# Patient Record
Sex: Female | Born: 1940 | Race: White | Hispanic: No | Smoking: Never smoker
Health system: Southern US, Community
[De-identification: ages and names within clinical notes are randomized; demographics above are authoritative.]

## PROBLEM LIST (undated history)

## (undated) DIAGNOSIS — H9193 Unspecified hearing loss, bilateral: Secondary | ICD-10-CM

## (undated) DIAGNOSIS — E782 Mixed hyperlipidemia: Secondary | ICD-10-CM

## (undated) DIAGNOSIS — K635 Polyp of colon: Secondary | ICD-10-CM

## (undated) DIAGNOSIS — E559 Vitamin D deficiency, unspecified: Secondary | ICD-10-CM

## (undated) DIAGNOSIS — M8580 Other specified disorders of bone density and structure, unspecified site: Secondary | ICD-10-CM

## (undated) DIAGNOSIS — E538 Deficiency of other specified B group vitamins: Secondary | ICD-10-CM

## (undated) HISTORY — PX: COLONOSCOPY: SHX174

## (undated) HISTORY — PX: BREAST EXCISIONAL BIOPSY: SUR124

---
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
## 1978-08-17 HISTORY — PX: APPENDECTOMY: SHX54

## 2011-04-03 ENCOUNTER — Ambulatory Visit: Payer: Self-pay | Admitting: Otolaryngology

## 2011-08-18 HISTORY — PX: SHOULDER ARTHROSCOPY W/ ROTATOR CUFF REPAIR: SHX2400

## 2012-07-08 ENCOUNTER — Ambulatory Visit: Payer: Self-pay | Admitting: Otolaryngology

## 2012-07-08 LAB — CREATININE, SERUM
EGFR (African American): >60
EGFR (Non-African Amer.): >60

## 2013-08-17 HISTORY — PX: CATARACT EXTRACTION W/ INTRAOCULAR LENS IMPLANT: SHX1309

## 2013-10-15 HISTORY — PX: CATARACT EXTRACTION W/ INTRAOCULAR LENS IMPLANT: SHX1309

## 2014-08-17 DIAGNOSIS — K811 Chronic cholecystitis: Secondary | ICD-10-CM

## 2014-08-17 HISTORY — DX: Chronic cholecystitis: K81.1

## 2015-02-15 HISTORY — PX: CHOLECYSTECTOMY: SHX55

## 2018-03-19 ENCOUNTER — Emergency Department
Admission: EM | Admit: 2018-03-19 | Discharge: 2018-03-19 | Disposition: A | Discharge status: Home / Self Care | Payer: Medicare Other | Attending: Emergency Medicine | Admitting: Emergency Medicine

## 2018-03-19 ENCOUNTER — Other Ambulatory Visit: Payer: Self-pay

## 2018-03-19 ENCOUNTER — Emergency Department: Payer: Medicare Other

## 2018-03-19 ENCOUNTER — Encounter: Payer: Self-pay | Admitting: Emergency Medicine

## 2018-03-19 DIAGNOSIS — Y9301 Activity, walking, marching and hiking: Secondary | ICD-10-CM | POA: Insufficient documentation

## 2018-03-19 DIAGNOSIS — S0990XA Unspecified injury of head, initial encounter: Secondary | ICD-10-CM | POA: Diagnosis present

## 2018-03-19 DIAGNOSIS — S01111A Laceration without foreign body of right eyelid and periocular area, initial encounter: Secondary | ICD-10-CM | POA: Insufficient documentation

## 2018-03-19 DIAGNOSIS — Y929 Unspecified place or not applicable: Secondary | ICD-10-CM | POA: Diagnosis not present

## 2018-03-19 DIAGNOSIS — Y999 Unspecified external cause status: Secondary | ICD-10-CM | POA: Diagnosis not present

## 2018-03-19 DIAGNOSIS — S0181XA Laceration without foreign body of other part of head, initial encounter: Secondary | ICD-10-CM

## 2018-03-19 DIAGNOSIS — W19XXXA Unspecified fall, initial encounter: Secondary | ICD-10-CM

## 2018-03-19 DIAGNOSIS — W108XXA Fall (on) (from) other stairs and steps, initial encounter: Secondary | ICD-10-CM | POA: Insufficient documentation

## 2018-03-19 DIAGNOSIS — S0083XA Contusion of other part of head, initial encounter: Secondary | ICD-10-CM

## 2018-03-19 MED ORDER — BACITRACIN ZINC 500 UNIT/GM EX OINT
1.0000 "application " | TOPICAL_OINTMENT | Freq: Once | CUTANEOUS | Status: AC
  Administered 2018-03-19: 1 via TOPICAL
  Filled 2018-03-19: qty 0.9

## 2018-03-19 MED ORDER — LIDOCAINE-EPINEPHRINE (PF) 2 %-1:200000 IJ SOLN
10.0000 mL | Freq: Once | INTRAMUSCULAR | Status: DC
  Filled 2018-03-19 (×2): qty 10

## 2018-03-19 MED ORDER — LIDOCAINE-EPINEPHRINE-TETRACAINE (LET) SOLUTION
3.0000 mL | Freq: Once | NASAL | Status: AC
  Administered 2018-03-19: 3 mL via TOPICAL
  Filled 2018-03-19: qty 3

## 2018-03-19 MED ORDER — LIDOCAINE HCL (PF) 1 % IJ SOLN
5.0000 mL | Freq: Once | INTRAMUSCULAR | Status: AC
  Administered 2018-03-19: 5 mL via INTRADERMAL
  Filled 2018-03-19: qty 5

## 2018-03-19 NOTE — ED Triage Notes (Addendum)
Pt to ED via POV with c/o mechanical fall at a pool party, pt has lac above RT eye. Bleeding controlled. Denies any LOC, denies blood thinner use. A&OX4, denies further injuries

## 2018-03-19 NOTE — ED Provider Notes (Signed)
[facility] Emergency Department Provider Note  ____________________________________________   First MD Initiated Contact with Patient 03/19/18 1410     (approximate)  I have reviewed the triage vital signs and the nursing notes.   HISTORY  Chief Complaint Fall    HPI Christie Ayers is a 77 y.o. female presents to the emergency department after falling down 2 steps and hitting her forehead on concrete.  She states she is very woozy but did not lose consciousness.  She denies any nausea vomiting but does states she has a bad headache in her neck hurts.  She denies any other injuries.  She states her tetanus is up-to-date.    History reviewed. No pertinent past medical history.  There are no active problems to display for this patient.   History reviewed. No pertinent surgical history.  Prior to Admission medications   Not on File    Allergies Patient has no known allergies.  No family history on file.  Social History Social History   Tobacco Use  . Smoking status: Never Smoker  . Smokeless tobacco: Never Used  Substance Use Topics  . Alcohol use: Not Currently  . Drug use: Not Currently    Review of Systems  Constitutional: No fever/chills Head: Positive for head injury Eyes: No visual changes. ENT: No sore throat. Respiratory: Denies cough Genitourinary: Negative for dysuria. Musculoskeletal: Negative for back pain. Skin: Negative for rash.  As for laceration to the right side of the face along the brow    ____________________________________________   PHYSICAL EXAM:  VITAL SIGNS: ED Triage Vitals  Enc Vitals Group     BP 03/19/18 1344 (!) 161/90     Pulse Rate 03/19/18 1344 75     Resp 03/19/18 1344 18     Temp 03/19/18 1344 97.6 F (36.4 C)     Temp Source 03/19/18 1344 Oral     SpO2 03/19/18 1344 99 %     Weight 03/19/18 1342 157 lb (71.2 kg)     Height 03/19/18 1342 5\' 5"  (1.651 m)     Head Circumference --       Peak Flow --      Pain Score 03/19/18 1342 2     Pain Loc --      Pain Edu? --      Excl. in GC? --     Constitutional: Alert and oriented. Well appearing and in no acute distress. Eyes: Conjunctivae are normal.  Head: Large hematoma across the right orbit where the eyelid is swollen shut.  There is a large laceration noted to the right brow Nose: No congestion/rhinnorhea. Mouth/Throat: Mucous membranes are moist.   Neck: Cervical spine is mildly tender, neck is supple no lymphadenopathy noted Cardiovascular: Normal rate, regular rhythm. Heart sounds are normal Respiratory: Normal respiratory effort.  No retractions, lungs c t a  Abd: soft nontender bs normal all 4 quad GU: deferred Musculoskeletal: FROM all extremities, warm and well perfused Neurologic:  Normal speech and language.  Skin:  Skin is warm, dry, positive for a 3 cm laceration to the right brow.   Psychiatric: Mood and affect are normal. Speech and behavior are normal.  ____________________________________________   LABS (all labs ordered are listed, but only abnormal results are displayed)  Labs Reviewed - No data to display ____________________________________________   ____________________________________________  RADIOLOGY  CT of the head, C-spine, and maxillofacial area are all negative for any acute abnormalities  ____________________________________________   PROCEDURES  Procedure(s) performed:   Marland Kitchen..Marland Kitchen  Laceration Repair Date/Time: 03/19/2018 4:41 PM Performed by: Faythe Ghee, PA-C Authorized by: Faythe Ghee, PA-C   Consent:    Consent obtained:  Verbal   Consent given by:  Patient   Risks discussed:  Infection, poor cosmetic result and poor wound healing   Alternatives discussed:  Delayed treatment Anesthesia (see MAR for exact dosages):    Anesthesia method:  Topical application and local infiltration   Topical anesthetic:  LET   Local anesthetic:  Lidocaine 1% w/o epi Laceration  details:    Location:  Face   Face location:  R eyebrow   Length (cm):  3   Depth (mm):  5 Repair type:    Repair type:  Intermediate Pre-procedure details:    Preparation:  Patient was prepped and draped in usual sterile fashion Exploration:    Hemostasis achieved with:  LET   Wound exploration: wound explored through full range of motion     Wound extent: no foreign bodies/material noted and no underlying fracture noted     Contaminated: no   Treatment:    Area cleansed with:  Betadine and saline   Amount of cleaning:  Standard   Irrigation solution:  Sterile saline   Irrigation method:  Tap   Visualized foreign bodies/material removed: no   Subcutaneous repair:    Suture size:  5-0   Suture material:  Fast-absorbing gut   Suture technique:  Simple interrupted   Number of sutures:  4 Skin repair:    Repair method:  Sutures   Suture size:  5-0   Suture material:  Nylon   Suture technique:  Simple interrupted   Number of sutures:  8 Approximation:    Approximation:  Close Post-procedure details:    Dressing:  Antibiotic ointment and non-adherent dressing   Patient tolerance of procedure:  Tolerated well, no immediate complications      ____________________________________________   INITIAL IMPRESSION / ASSESSMENT AND PLAN / ED COURSE  Pertinent labs & imaging results that were available during my care of the patient were reviewed by me and considered in my medical decision making (see chart for details).   Patient is a 77 year old female with presents to the emergency department with a coworker after she fell down 2 steps and landing face first on concrete.  She has a large laceration to the right brow.  States she had a glasses and they broke.  She is had some dizziness and felt unstable after the fall but has not had any loss of consciousness, nausea or vomiting.  She states her tetanus is up-to-date.  On physical exam the patient has a very large bruise to the  right brow and right upper eyelid.  The eyelid is so swollen she cannot open it.  The laceration is about 4 cm long and is very deep.  The neck is slightly tender.  There is no abrasions on the wrist or the forearms.  No other bony tenderness is noted.  CT of the head, maxillofacial, and C-spine are all negative  The results were discussed with the patient.  The area was repaired with internal and external sutures.  Patient tolerated procedure well.  She is to follow-up with her regular doctor in 5 to 6 days for suture removal.  A dressing was applied by nursing staff and the patient was discharged in stable condition in the care of her friend.       As part of my medical decision making, I reviewed the following data  within the electronic MEDICAL RECORD NUMBER Nursing notes reviewed and incorporated, Old chart reviewed, Radiograph reviewed CT of the head, C-spine, and maxillofacial are all without contrast and are negative for any acute abnormalities, Notes from prior ED visits and [place] Controlled Substance Database  ____________________________________________   FINAL CLINICAL IMPRESSION(S) / ED DIAGNOSES  Final diagnoses:  Fall, initial encounter  Contusion of face, initial encounter  Facial laceration, initial encounter      NEW MEDICATIONS STARTED DURING THIS VISIT:  There are no discharge medications for this patient.    Note:  This document was prepared using Dragon voice recognition software and may include unintentional dictation errors.    Faythe Ghee, PA-C 03/19/18 1645    Jeanmarie Plant, MD 03/20/18 [phone]

## 2018-03-19 NOTE — Discharge Instructions (Addendum)
Clean the wound with soap and water only.  Apply Neosporin or Vaseline once daily only.  Do not use hydrogen peroxide on your wound as it will make scar worse.  Once the sutures are removed please use sunscreen daily on the laceration.  Can also use cocoa butter or Mederma to decrease the scarring.  If there is any sign of infection such as redness pus swelling or increased pain please return the emergency department or your regular doctor.

## 2018-03-19 NOTE — ED Notes (Signed)
Missed  a step  And fell down 2 steps  - striking r forehead on cement   Did not black  Out no nausea or vomiting  Dressing intact

## 2019-09-14 IMAGING — CT CT MAXILLOFACIAL W/O CM
4 of 11 series · 16 of 47 positions shown, 18 images · non-contrast
Comparison: None.

CLINICAL DATA: Fall, right eye injury, dizziness, headache

EXAM:
CT HEAD WITHOUT CONTRAST
CT MAXILLOFACIAL WITHOUT CONTRAST
CT CERVICAL SPINE WITHOUT CONTRAST
TECHNIQUE: Multidetector CT imaging of the head, cervical spine, and
maxillofacial structures were performed using the standard protocol
without intravenous contrast. Multiplanar CT image reconstructions
of the cervical spine and maxillofacial structures were also
generated.

[Series 12: coronal soft · coronal · 0.32mm/px · 3 of 78 slices shown]
[im 20/78  bone]
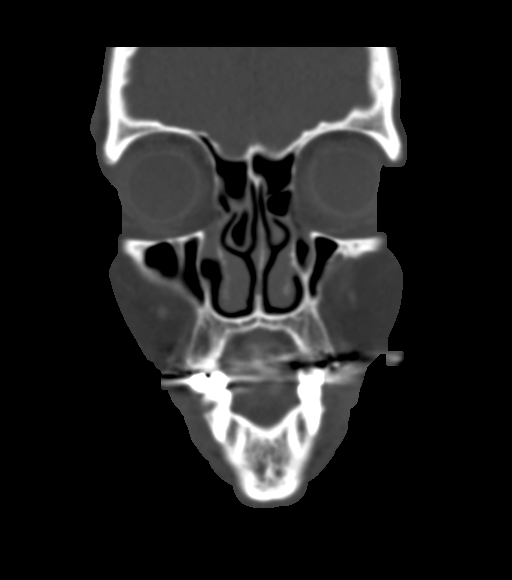
[im 39/78  bone]
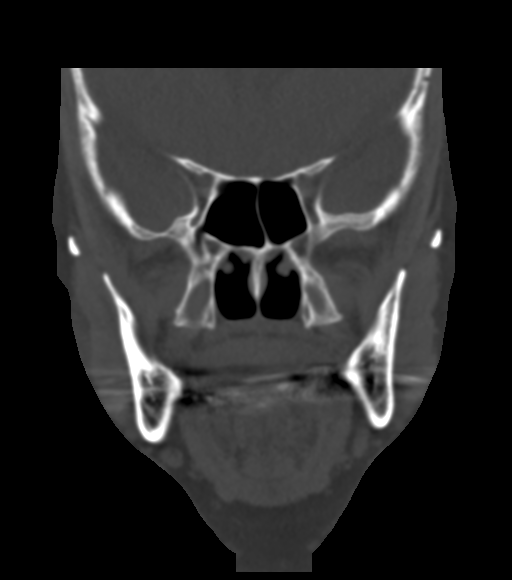
[im 58/78  bone]
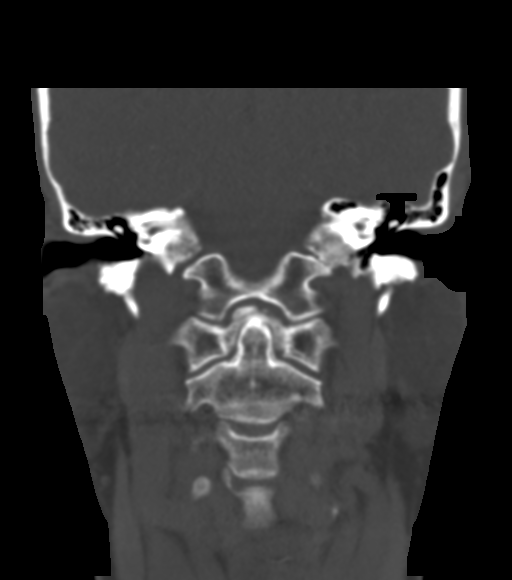

[Series 13: sagittal soft · sagittal · 0.37mm/px · 1 of 72 slices shown]
[im 36/72  bone]
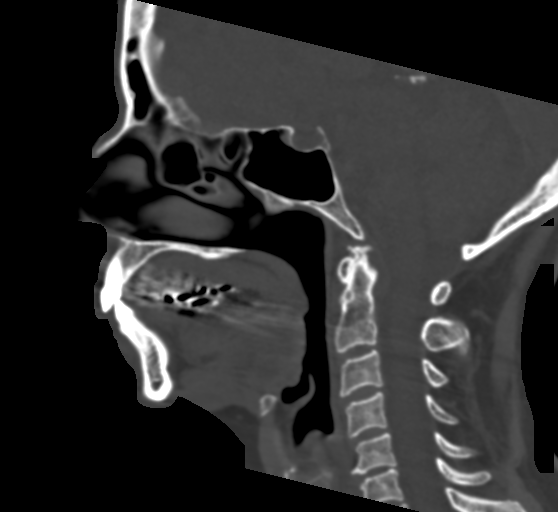

[Series 17: c spine soft · axial · 0.26mm/px · z∈[-304,-238]mm · 4 of 87 slices shown]
[im 11/87  brain]
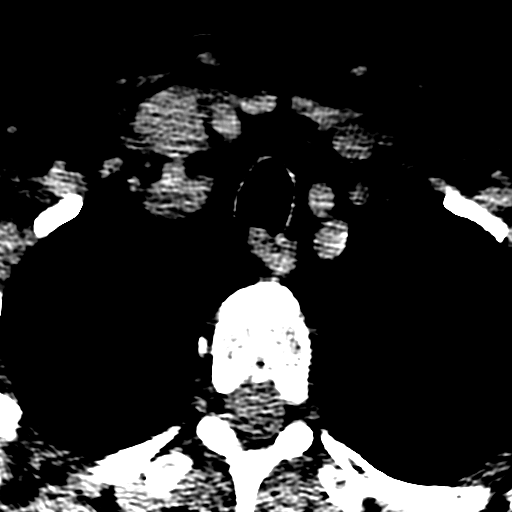
[im 22/87  brain]
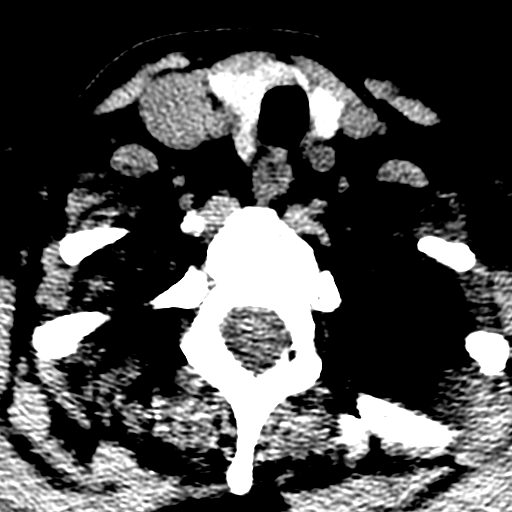
[im 33/87  brain]
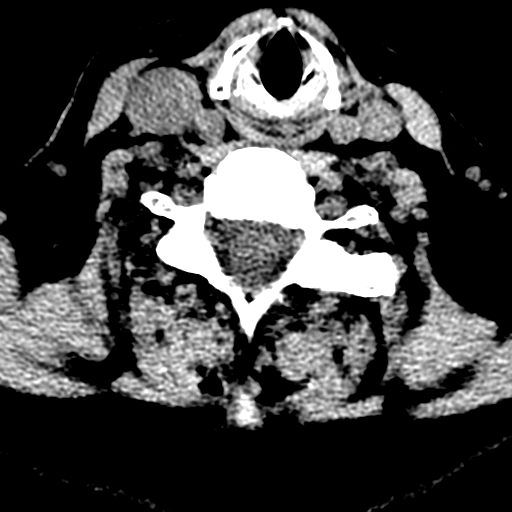
[im 44/87  brain]
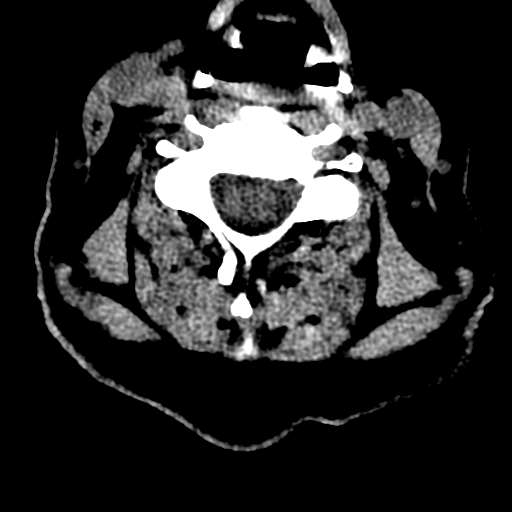

[Series 20: orthogonal axials · axial · 0.23mm/px · z∈[-330,-190]mm · 8 of 102 slices shown, 10 images]
[im 12/102  brain]
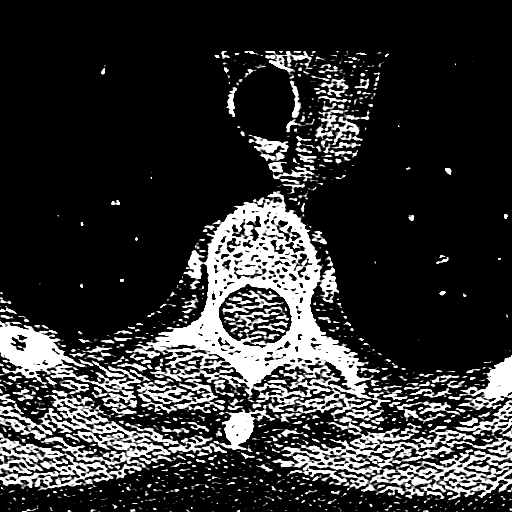
[im 12/102  bone]
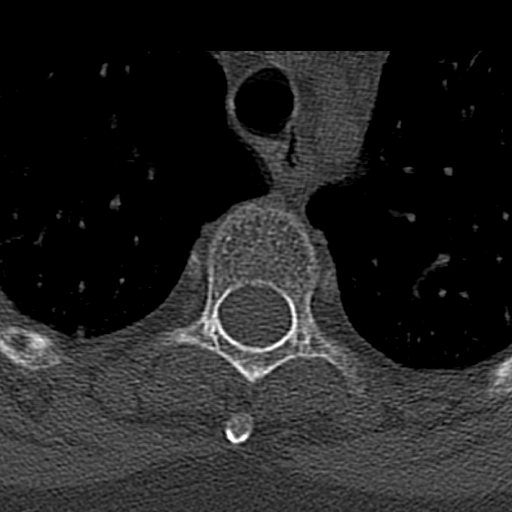
[im 23/102  bone]
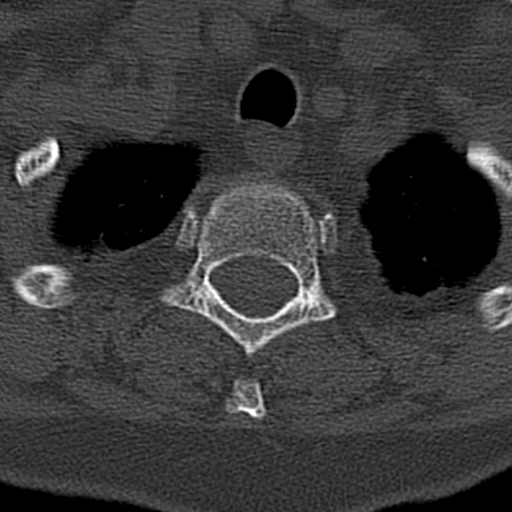
[im 34/102  bone]
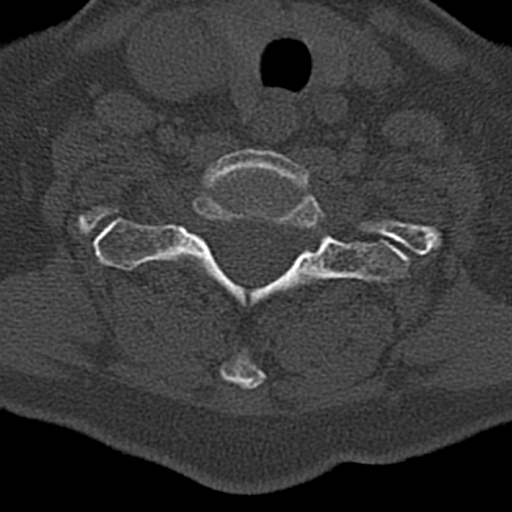
[im 45/102  bone]
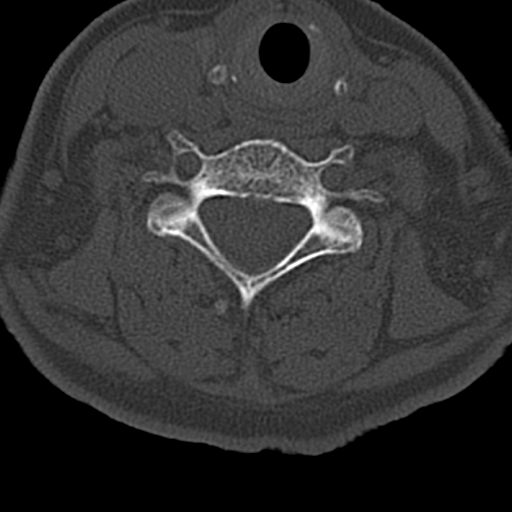
[im 57/102  brain]
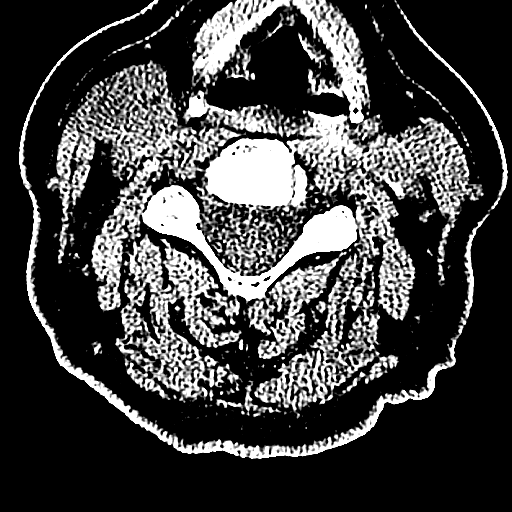
[im 57/102  bone]
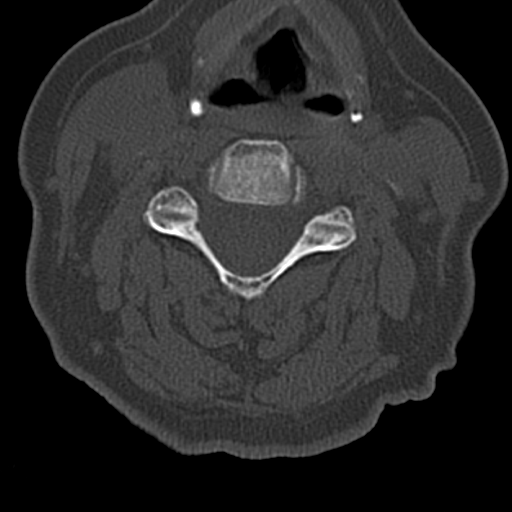
[im 68/102  bone]
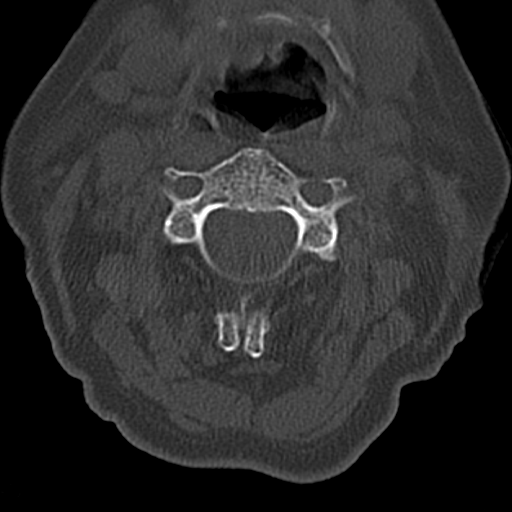
[im 79/102  bone]
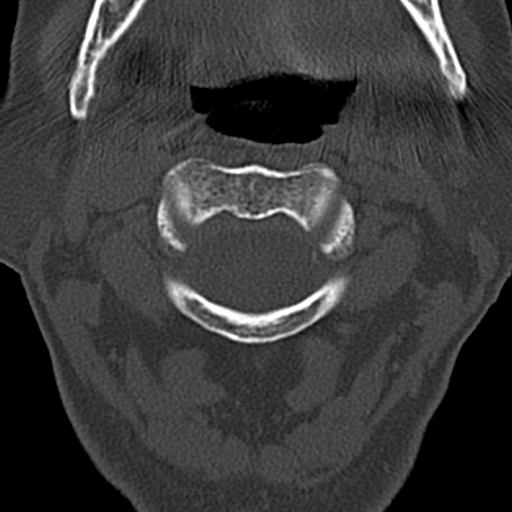
[im 90/102  bone]
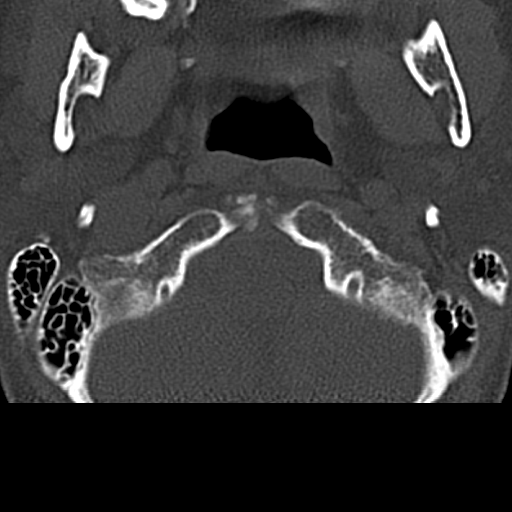

[16 of 47 positions shown; findings below may reference images not displayed]

FINDINGS: CT HEAD FINDINGS

Brain: No evidence of acute infarction, hemorrhage, hydrocephalus,
extra-axial collection or mass lesion/mass effect.

Mild cortical atrophy.

Vascular: Mild intracranial atherosclerosis.

Skull: Normal. Negative for fracture or focal lesion.

Other: None.

CT MAXILLOFACIAL FINDINGS

Osseous: No evidence of maxillofacial fracture. Mandible is intact.
Bilateral mandibular condyles are well-seated in the TMJs.

Orbits: Bilateral orbits, including the globes and retroconal soft
tissues, are within normal limits.

Sinuses: The visualized paranasal sinuses are essentially clear. The
mastoid air cells are unopacified.

Soft tissues: Mild soft tissue swelling overlying the right orbit
(series 8/image 21) and lateral zygoma/frontal bone (series 8/image
9).

CT CERVICAL SPINE FINDINGS

Alignment: Normal cervical lordosis.

Skull base and vertebrae: No acute fracture. No primary bone lesion
or focal pathologic process.

Soft tissues and spinal canal: No prevertebral fluid or swelling. No
visible canal hematoma.

Disc levels: Mild degenerative changes at C5-6. Spinal canal is
patent.

Upper chest: Visualized lung apices are clear.

Other: Visualized thyroid is unremarkable.
IMPRESSION: No evidence of acute intracranial abnormality. Mild cortical
atrophy.

Mild soft tissue swelling overlying the right frontal bone and
orbit. No evidence of maxillofacial fracture.

No evidence of traumatic injury to the cervical spine. Mild
degenerative changes at C5-6.

## 2023-07-27 ENCOUNTER — Other Ambulatory Visit: Payer: Self-pay | Admitting: Orthopedic Surgery

## 2023-07-27 DIAGNOSIS — M25512 Pain in left shoulder: Secondary | ICD-10-CM

## 2023-08-04 ENCOUNTER — Encounter: Payer: Self-pay | Admitting: Orthopedic Surgery

## 2023-08-14 ENCOUNTER — Ambulatory Visit
Admission: RE | Admit: 2023-08-14 | Discharge: 2023-08-14 | Disposition: A | Discharge status: Home / Self Care | Payer: Medicare HMO | Source: Ambulatory Visit | Attending: Orthopedic Surgery

## 2023-08-14 DIAGNOSIS — M25512 Pain in left shoulder: Secondary | ICD-10-CM

## 2023-09-28 ENCOUNTER — Ambulatory Visit
Admission: RE | Admit: 2023-09-28 | Discharge: 2023-09-28 | Disposition: A | Discharge status: Home / Self Care | Payer: Medicare HMO | Source: Ambulatory Visit | Attending: Orthopedic Surgery | Admitting: Orthopedic Surgery

## 2023-09-28 ENCOUNTER — Other Ambulatory Visit: Payer: Self-pay | Admitting: Orthopedic Surgery

## 2023-09-28 DIAGNOSIS — M19012 Primary osteoarthritis, left shoulder: Secondary | ICD-10-CM | POA: Diagnosis present

## 2023-10-04 ENCOUNTER — Other Ambulatory Visit: Payer: Self-pay | Admitting: Orthopedic Surgery

## 2023-10-12 ENCOUNTER — Inpatient Hospital Stay: Admission: RE | Admit: 2023-10-12 | Payer: Medicare HMO | Source: Ambulatory Visit

## 2023-10-19 ENCOUNTER — Ambulatory Visit: Admission: RE | Admit: 2023-10-19 | Payer: Medicare HMO | Source: Home / Self Care | Admitting: Orthopedic Surgery

## 2023-10-19 ENCOUNTER — Encounter: Admission: RE | Payer: Self-pay | Source: Home / Self Care

## 2023-10-19 SURGERY — REVERSE SHOULDER ARTHROPLASTY
Anesthesia: Choice | Site: Shoulder | Laterality: Left

## 2023-11-03 ENCOUNTER — Other Ambulatory Visit: Payer: Self-pay | Admitting: Orthopedic Surgery

## 2023-11-08 ENCOUNTER — Inpatient Hospital Stay: Admission: RE | Admit: 2023-11-08 | Source: Ambulatory Visit

## 2023-11-09 ENCOUNTER — Encounter
Admission: RE | Admit: 2023-11-09 | Discharge: 2023-11-09 | Disposition: A | Discharge status: Home / Self Care | Source: Ambulatory Visit | Attending: Orthopedic Surgery | Admitting: Orthopedic Surgery

## 2023-11-09 ENCOUNTER — Other Ambulatory Visit: Payer: Self-pay

## 2023-11-09 VITALS — BP 156/81 | HR 65 | Temp 98.0°F | Resp 14 | Ht 65.0 in | Wt 160.0 lb

## 2023-11-09 DIAGNOSIS — Z01812 Encounter for preprocedural laboratory examination: Secondary | ICD-10-CM

## 2023-11-09 DIAGNOSIS — Z01818 Encounter for other preprocedural examination: Secondary | ICD-10-CM | POA: Insufficient documentation

## 2023-11-09 HISTORY — DX: Vitamin D deficiency, unspecified: E55.9

## 2023-11-09 HISTORY — DX: Other specified disorders of bone density and structure, unspecified site: M85.80

## 2023-11-09 HISTORY — DX: Deficiency of other specified B group vitamins: E53.8

## 2023-11-09 HISTORY — DX: Unspecified hearing loss, bilateral: H91.93

## 2023-11-09 HISTORY — DX: Mixed hyperlipidemia: E78.2

## 2023-11-09 HISTORY — DX: Polyp of colon: K63.5

## 2023-11-09 LAB — CBC WITH DIFFERENTIAL/PLATELET
Abs Immature Granulocytes: 0.01 10*3/uL (ref 0.00–0.07)
Basophils Absolute: 0 10*3/uL (ref 0.0–0.1)
Basophils Relative: 0 %
Eosinophils Absolute: 0.1 10*3/uL (ref 0.0–0.5)
Eosinophils Relative: 2 %
HCT: 42.4 % (ref 36.0–46.0)
Hemoglobin: 14.1 g/dL (ref 12.0–15.0)
Immature Granulocytes: 0 %
Lymphocytes Relative: 26 %
Lymphs Abs: 1.6 10*3/uL (ref 0.7–4.0)
MCH: 30.3 pg (ref 26.0–34.0)
MCHC: 33.3 g/dL (ref 30.0–36.0)
MCV: 91 fL (ref 80.0–100.0)
Monocytes Absolute: 0.6 10*3/uL (ref 0.1–1.0)
Monocytes Relative: 9 %
Neutro Abs: 4 10*3/uL (ref 1.7–7.7)
Neutrophils Relative %: 63 %
Platelets: 238 10*3/uL (ref 150–400)
RBC: 4.66 MIL/uL (ref 3.87–5.11)
RDW: 12.4 % (ref 11.5–15.5)
WBC: 6.4 10*3/uL (ref 4.0–10.5)
nRBC: 0 % (ref 0.0–0.2)

## 2023-11-09 LAB — URINALYSIS, ROUTINE W REFLEX MICROSCOPIC
Bacteria, UA: NONE SEEN
Bilirubin Urine: NEGATIVE
Glucose, UA: NEGATIVE mg/dL
Ketones, ur: NEGATIVE mg/dL
Leukocytes,Ua: NEGATIVE
Nitrite: NEGATIVE
Protein, ur: NEGATIVE mg/dL
Specific Gravity, Urine: 1.004 — ABNORMAL LOW (ref 1.005–1.030)
Squamous Epithelial / HPF: 0 /HPF (ref 0–5)
WBC, UA: 0 WBC/hpf (ref 0–5)
pH: 7 (ref 5.0–8.0)

## 2023-11-09 LAB — COMPREHENSIVE METABOLIC PANEL
ALT: 19 U/L (ref 0–44)
AST: 17 U/L (ref 15–41)
Albumin: 4.5 g/dL (ref 3.5–5.0)
Alkaline Phosphatase: 43 U/L (ref 38–126)
Anion gap: 9 (ref 5–15)
BUN: 16 mg/dL (ref 8–23)
CO2: 24 mmol/L (ref 22–32)
Calcium: 9.1 mg/dL (ref 8.9–10.3)
Chloride: 106 mmol/L (ref 98–111)
Creatinine, Ser: 0.76 mg/dL (ref 0.44–1.00)
GFR, Estimated: >60 mL/min (ref >60)
Glucose, Bld: 89 mg/dL (ref 70–99)
Potassium: 3.9 mmol/L (ref 3.5–5.1)
Sodium: 139 mmol/L (ref 135–145)
Total Bilirubin: 0.9 mg/dL (ref 0.0–1.2)
Total Protein: 7 g/dL (ref 6.5–8.1)

## 2023-11-09 LAB — SURGICAL PCR SCREEN
MRSA, PCR: NEGATIVE
Staphylococcus aureus: NEGATIVE

## 2023-11-09 NOTE — Patient Instructions (Signed)
 Your procedure is scheduled on: Tuesday, April 1 Report to the Registration Desk on the 1st floor of the [facility] To find out your arrival time, please call [phone] between 1PM - 3PM on: Monday, March 31 If your arrival time is 6:00 am, do not arrive before that time as the [facility] entrance doors do not open until 6:00 am.  REMEMBER: Instructions that are not followed completely may result in serious medical risk, up to and including death; or upon the discretion of your surgeon and anesthesiologist your surgery may need to be rescheduled.  Do not eat food after midnight the night before surgery.  No gum chewing or hard candies.  You may however, drink CLEAR liquids up to 2 hours before you are scheduled to arrive for your surgery. Do not drink anything within 2 hours of your scheduled arrival time.  Clear liquids include: - water  - apple juice without pulp - gatorade (not RED colors) - black coffee or tea (Do NOT add milk or creamers to the coffee or tea) Do NOT drink anything that is not on this list.  In addition, your doctor has ordered for you to drink the provided:  Ensure Pre-Surgery Clear Carbohydrate Drink  Drinking this carbohydrate drink up to two hours before surgery helps to reduce insulin resistance and improve patient outcomes. Please complete drinking 2 hours before scheduled arrival time.  One week prior to surgery: starting March 25 Stop Anti-inflammatories (NSAIDS) such as Advil, Aleve, Ibuprofen, Motrin, Naproxen, Naprosyn and Aspirin based products such as Excedrin, Goody's Powder, BC Powder. Stop ANY OVER THE COUNTER supplements until after surgery. Stop vitamin D, B.  You may however, continue to take Tylenol if needed for pain up until the day of surgery.  Continue taking all of your other prescription medications up until the day of surgery.  ON THE DAY OF SURGERY DO NOT TAKE ANY MEDICATIONS   No Alcohol for 24 hours before or after  surgery.  No Smoking including e-cigarettes for 24 hours before surgery.  No chewable tobacco products for at least 6 hours before surgery.  No nicotine patches on the day of surgery.  Do not use any "recreational" drugs for at least a week (preferably 2 weeks) before your surgery.  Please be advised that the combination of cocaine and anesthesia may have negative outcomes, up to and including death. If you test positive for cocaine, your surgery will be cancelled.  On the morning of surgery brush your teeth with toothpaste and water, you may rinse your mouth with mouthwash if you wish. Do not swallow any toothpaste or mouthwash.  Use CHG Soap as directed on instruction sheet.  Do not wear jewelry, make-up, hairpins, clips or nail polish.  For welded (permanent) jewelry: bracelets, anklets, waist bands, etc.  Please have this removed prior to surgery.  If it is not removed, there is a chance that hospital personnel will need to cut it off on the day of surgery.  Do not wear lotions, powders, or perfumes.   Do not shave body hair from the neck down 48 hours before surgery.  Contact lenses, hearing aids and dentures may not be worn into surgery.  Do not bring valuables to the hospital. [facility] is not responsible for any missing/lost belongings or valuables.   Total Shoulder Arthroplasty:  use Benzoyl Peroxide 5% Gel as directed on instruction sheet.  Notify your doctor if there is any change in your medical condition (cold, fever, infection).  Wear comfortable clothing (specific to your surgery type) to the hospital.  After surgery, you can help prevent lung complications by doing breathing exercises.  Take deep breaths and cough every 1-2 hours. Your doctor may order a device called an Incentive Spirometer to help you take deep breaths.  If you are being discharged the day of surgery, you will not be allowed to drive home. You will need a responsible individual to drive you  home and stay with you for 24 hours after surgery.   If you are taking public transportation, you will need to have a responsible individual with you.  Please call the Pre-admissions Testing Dept. at [phone] if you have any questions about these instructions.  Surgery Visitation Policy:  Patients having surgery or a procedure may have two visitors.  Children under the age of 29 must have an adult with them who is not the patient.  Temporary Visitor Restrictions Due to increasing cases of flu, RSV and COVID-19: Children ages 34 and under will not be able to visit patients in [facility] hospitals under most circumstances.     Pre-operative 5 CHG Bath Instructions   You can play a key role in reducing the risk of infection after surgery. Your skin needs to be as free of germs as possible. You can reduce the number of germs on your skin by washing with CHG (chlorhexidine gluconate) soap before surgery. CHG is an antiseptic soap that kills germs and continues to kill germs even after washing.   DO NOT use if you have an allergy to chlorhexidine/CHG or antibacterial soaps. If your skin becomes reddened or irritated, stop using the CHG and notify one of our RNs at [phone].   Please shower with the CHG soap starting 4 days before surgery using the following schedule:     Please keep in mind the following:  DO NOT shave, including legs and underarms, starting the day of your first shower.   You may shave your face at any point before/day of surgery.  Place clean sheets on your bed the day you start using CHG soap. Use a clean washcloth (not used since being washed) for each shower. DO NOT sleep with pets once you start using the CHG.   CHG Shower Instructions:  If you choose to wash your hair and private area, wash first with your normal shampoo/soap.  After you use shampoo/soap, rinse your hair and body thoroughly to remove shampoo/soap residue.  Turn the water OFF and  apply about 3 tablespoons (45 ml) of CHG soap to a CLEAN washcloth.  Apply CHG soap ONLY FROM YOUR NECK DOWN TO YOUR TOES (washing for 3-5 minutes)  DO NOT use CHG soap on face, private areas, open wounds, or sores.  Pay special attention to the area where your surgery is being performed.  If you are having back surgery, having someone wash your back for you may be helpful. Wait 2 minutes after CHG soap is applied, then you may rinse off the CHG soap.  Pat dry with a clean towel  Put on clean clothes/pajamas   If you choose to wear lotion, please use ONLY the CHG-compatible lotions on the back of this paper.     Additional instructions for the day of surgery: DO NOT APPLY any lotions, deodorants, cologne, or perfumes.   Put on clean/comfortable clothes.  Brush your teeth.  Ask your nurse before applying any prescription medications to the skin.      CHG Compatible Lotions  Aveeno Moisturizing lotion  Cetaphil Moisturizing Cream  Cetaphil Moisturizing Lotion  Clairol Herbal Essence Moisturizing Lotion, Dry Skin  Clairol Herbal Essence Moisturizing Lotion, Extra Dry Skin  Clairol Herbal Essence Moisturizing Lotion, Normal Skin  Curel Age Defying Therapeutic Moisturizing Lotion with Alpha Hydroxy  Curel Extreme Care Body Lotion  Curel Soothing Hands Moisturizing Hand Lotion  Curel Therapeutic Moisturizing Cream, Fragrance-Free  Curel Therapeutic Moisturizing Lotion, Fragrance-Free  Curel Therapeutic Moisturizing Lotion, Original Formula  Eucerin Daily Replenishing Lotion  Eucerin Dry Skin Therapy Plus Alpha Hydroxy Crme  Eucerin Dry Skin Therapy Plus Alpha Hydroxy Lotion  Eucerin Original Crme  Eucerin Original Lotion  Eucerin Plus Crme Eucerin Plus Lotion  Eucerin TriLipid Replenishing Lotion  Keri Anti-Bacterial Hand Lotion  Keri Deep Conditioning Original Lotion Dry Skin Formula Softly Scented  Keri Deep Conditioning Original Lotion, Fragrance Free Sensitive Skin  Formula  Keri Lotion Fast Absorbing Fragrance Free Sensitive Skin Formula  Keri Lotion Fast Absorbing Softly Scented Dry Skin Formula  Keri Original Lotion  Keri Skin Renewal Lotion Keri Silky Smooth Lotion  Keri Silky Smooth Sensitive Skin Lotion  Nivea Body Creamy Conditioning Oil  Nivea Body Extra Enriched Lotion  Nivea Body Original Lotion  Nivea Body Sheer Moisturizing Lotion Nivea Crme  Nivea Skin Firming Lotion  NutraDerm 30 Skin Lotion  NutraDerm Skin Lotion  NutraDerm Therapeutic Skin Cream  NutraDerm Therapeutic Skin Lotion  ProShield Protective Hand Cream  Provon moisturizing lotion   Preparing for Total Shoulder Arthroplasty  Before surgery, you can play an important role by reducing the number of germs on your skin by using the following products:  Benzoyl Peroxide Gel  o Reduces the number of germs present on the skin  o Applied twice a day to shoulder area starting two days before surgery  Chlorhexidine Gluconate (CHG) Soap  o An antiseptic cleaner that kills germs and bonds with the skin to continue killing germs even after washing  o Used for showering the night before surgery and morning of surgery  BENZOYL PEROXIDE 5% GEL  Please do not use if you have an allergy to benzoyl peroxide. If your skin becomes reddened/irritated stop using the benzoyl peroxide.  Starting two days before surgery, apply as follows:  1. Apply benzoyl peroxide in the morning and at night. Apply after taking a shower. If you are not taking a shower, clean entire shoulder front, back, and side along with the armpit with a clean wet washcloth.  2. Place a quarter-sized dollop on your shoulder and rub in thoroughly, making sure to cover the front, back, and side of your shoulder, along with the armpit.  2 days before ____ AM ____ PM 1 day before ____ AM ____ PM  3. Do this twice a day for two days. (Last application is the night before surgery, AFTER using the CHG soap).  4. Do  NOT apply benzoyl peroxide gel on the day of surgery.      Preoperative Educational Videos for Total Hip, Knee and Shoulder Replacements  To better prepare for surgery, please view our videos that explain the physical activity and discharge planning required to have the best surgical recovery at [facility].  [email or web address]  Questions? Call [phone] or email [email or web address]

## 2023-11-16 ENCOUNTER — Ambulatory Visit
Admission: RE | Admit: 2023-11-16 | Discharge: 2023-11-16 | Disposition: A | Discharge status: Home / Self Care | Source: Ambulatory Visit | Attending: Orthopedic Surgery | Admitting: Orthopedic Surgery

## 2023-11-16 ENCOUNTER — Other Ambulatory Visit: Payer: Self-pay

## 2023-11-16 ENCOUNTER — Encounter
Admission: RE | Disposition: A | Discharge status: Home / Self Care | Payer: Self-pay | Source: Ambulatory Visit | Attending: Orthopedic Surgery

## 2023-11-16 ENCOUNTER — Encounter: Payer: Self-pay | Admitting: Orthopedic Surgery

## 2023-11-16 ENCOUNTER — Ambulatory Visit: Payer: Self-pay | Admitting: Urgent Care

## 2023-11-16 ENCOUNTER — Ambulatory Visit

## 2023-11-16 DIAGNOSIS — Z9889 Other specified postprocedural states: Secondary | ICD-10-CM | POA: Diagnosis not present

## 2023-11-16 DIAGNOSIS — M19012 Primary osteoarthritis, left shoulder: Secondary | ICD-10-CM | POA: Diagnosis present

## 2023-11-16 HISTORY — PX: REVERSE SHOULDER ARTHROPLASTY: SHX5054

## 2023-11-16 SURGERY — ARTHROPLASTY, SHOULDER, TOTAL, REVERSE
Anesthesia: General | Site: Shoulder | Laterality: Left

## 2023-11-16 MED ORDER — ALBUMIN HUMAN 5 % IV SOLN
INTRAVENOUS | Status: AC
  Filled 2023-11-16: qty 250

## 2023-11-16 MED ORDER — ONDANSETRON 4 MG PO TBDP: 4.0000 mg | ORAL_TABLET | Freq: Three times a day (TID) | ORAL | 0 refills | Status: AC | PRN

## 2023-11-16 MED ORDER — OXYCODONE HCL 5 MG PO TABS
ORAL_TABLET | ORAL | Status: AC
  Filled 2023-11-16: qty 1

## 2023-11-16 MED ORDER — LIDOCAINE HCL (PF) 1 % IJ SOLN
INTRAMUSCULAR | Status: DC | PRN
  Administered 2023-11-16: 3 mL

## 2023-11-16 MED ORDER — PHENYLEPHRINE HCL-NACL 20-0.9 MG/250ML-% IV SOLN
INTRAVENOUS | Status: AC
  Filled 2023-11-16: qty 250

## 2023-11-16 MED ORDER — LACTATED RINGERS IV SOLN: INTRAVENOUS | Status: DC

## 2023-11-16 MED ORDER — PROPOFOL 10 MG/ML IV BOLUS
INTRAVENOUS | Status: AC
  Filled 2023-11-16: qty 20

## 2023-11-16 MED ORDER — ONDANSETRON HCL 4 MG/2ML IJ SOLN
INTRAMUSCULAR | Status: DC | PRN
  Administered 2023-11-16: 4 mg via INTRAVENOUS

## 2023-11-16 MED ORDER — ACETAMINOPHEN 10 MG/ML IV SOLN
INTRAVENOUS | Status: DC | PRN
  Administered 2023-11-16: 1000 mg via INTRAVENOUS

## 2023-11-16 MED ORDER — SUGAMMADEX SODIUM 200 MG/2ML IV SOLN
INTRAVENOUS | Status: AC
  Filled 2023-11-16: qty 2

## 2023-11-16 MED ORDER — CEFAZOLIN SODIUM-DEXTROSE 2-4 GM/100ML-% IV SOLN
INTRAVENOUS | Status: AC
  Filled 2023-11-16: qty 100

## 2023-11-16 MED ORDER — PHENYLEPHRINE 80 MCG/ML (10ML) SYRINGE FOR IV PUSH (FOR BLOOD PRESSURE SUPPORT)
PREFILLED_SYRINGE | INTRAVENOUS | Status: DC | PRN
  Administered 2023-11-16 (×2): 80 ug via INTRAVENOUS

## 2023-11-16 MED ORDER — 0.9 % SODIUM CHLORIDE (POUR BTL) OPTIME
TOPICAL | Status: DC | PRN
  Administered 2023-11-16: 500 mL

## 2023-11-16 MED ORDER — CHLORHEXIDINE GLUCONATE 0.12 % MT SOLN
OROMUCOSAL | Status: AC
  Filled 2023-11-16: qty 15

## 2023-11-16 MED ORDER — LIDOCAINE HCL (PF) 1 % IJ SOLN
INTRAMUSCULAR | Status: AC
  Filled 2023-11-16: qty 5

## 2023-11-16 MED ORDER — OXYCODONE HCL 5 MG PO TABS
5.0000 mg | ORAL_TABLET | Freq: Once | ORAL | Status: AC | PRN
  Administered 2023-11-16: 5 mg via ORAL

## 2023-11-16 MED ORDER — ACETAMINOPHEN 10 MG/ML IV SOLN
INTRAVENOUS | Status: AC
  Filled 2023-11-16: qty 100

## 2023-11-16 MED ORDER — DEXAMETHASONE SODIUM PHOSPHATE 10 MG/ML IJ SOLN
INTRAMUSCULAR | Status: AC
  Filled 2023-11-16: qty 1

## 2023-11-16 MED ORDER — ROCURONIUM BROMIDE 100 MG/10ML IV SOLN
INTRAVENOUS | Status: DC | PRN
  Administered 2023-11-16: 40 mg via INTRAVENOUS
  Administered 2023-11-16: 30 mg via INTRAVENOUS

## 2023-11-16 MED ORDER — VANCOMYCIN HCL 1000 MG IV SOLR
INTRAVENOUS | Status: AC
  Filled 2023-11-16: qty 20

## 2023-11-16 MED ORDER — OXYCODONE HCL 5 MG/5ML PO SOLN: 5.0000 mg | Freq: Once | ORAL | Status: AC | PRN

## 2023-11-16 MED ORDER — ALBUMIN HUMAN 5 % IV SOLN: INTRAVENOUS | Status: DC | PRN

## 2023-11-16 MED ORDER — FENTANYL CITRATE PF 50 MCG/ML IJ SOSY
PREFILLED_SYRINGE | INTRAMUSCULAR | Status: AC
  Filled 2023-11-16: qty 1

## 2023-11-16 MED ORDER — ORAL CARE MOUTH RINSE: 15.0000 mL | Freq: Once | OROMUCOSAL | Status: AC

## 2023-11-16 MED ORDER — ONDANSETRON HCL 4 MG/2ML IJ SOLN
4.0000 mg | Freq: Once | INTRAMUSCULAR | Status: AC
  Administered 2023-11-16: 4 mg via INTRAVENOUS

## 2023-11-16 MED ORDER — PHENYLEPHRINE 80 MCG/ML (10ML) SYRINGE FOR IV PUSH (FOR BLOOD PRESSURE SUPPORT)
PREFILLED_SYRINGE | INTRAVENOUS | Status: AC
  Filled 2023-11-16: qty 10

## 2023-11-16 MED ORDER — FENTANYL CITRATE (PF) 100 MCG/2ML IJ SOLN
INTRAMUSCULAR | Status: DC | PRN
  Administered 2023-11-16 (×2): 50 ug via INTRAVENOUS

## 2023-11-16 MED ORDER — ROCURONIUM BROMIDE 10 MG/ML (PF) SYRINGE
PREFILLED_SYRINGE | INTRAVENOUS | Status: AC
  Filled 2023-11-16: qty 10

## 2023-11-16 MED ORDER — BUPIVACAINE LIPOSOME 1.3 % IJ SUSP
INTRAMUSCULAR | Status: AC
  Filled 2023-11-16: qty 20

## 2023-11-16 MED ORDER — BUPIVACAINE LIPOSOME 1.3 % IJ SUSP
INTRAMUSCULAR | Status: DC | PRN
  Administered 2023-11-16: 20 mL

## 2023-11-16 MED ORDER — FENTANYL CITRATE (PF) 100 MCG/2ML IJ SOLN: 25.0000 ug | INTRAMUSCULAR | Status: DC | PRN

## 2023-11-16 MED ORDER — SUGAMMADEX SODIUM 200 MG/2ML IV SOLN
INTRAVENOUS | Status: DC | PRN
  Administered 2023-11-16: 150 mg via INTRAVENOUS
  Administered 2023-11-16: 50 mg via INTRAVENOUS

## 2023-11-16 MED ORDER — FENTANYL CITRATE (PF) 100 MCG/2ML IJ SOLN
INTRAMUSCULAR | Status: AC
  Filled 2023-11-16: qty 2

## 2023-11-16 MED ORDER — LIDOCAINE HCL (PF) 2 % IJ SOLN
INTRAMUSCULAR | Status: AC
  Filled 2023-11-16: qty 5

## 2023-11-16 MED ORDER — VANCOMYCIN HCL 1000 MG IV SOLR
INTRAVENOUS | Status: DC | PRN
  Administered 2023-11-16: 1000 mg

## 2023-11-16 MED ORDER — EPHEDRINE 5 MG/ML INJ
INTRAVENOUS | Status: AC
  Filled 2023-11-16: qty 5

## 2023-11-16 MED ORDER — ONDANSETRON HCL 4 MG/2ML IJ SOLN
INTRAMUSCULAR | Status: AC
  Filled 2023-11-16: qty 2

## 2023-11-16 MED ORDER — TRANEXAMIC ACID-NACL 1000-0.7 MG/100ML-% IV SOLN
INTRAVENOUS | Status: AC
  Filled 2023-11-16: qty 100

## 2023-11-16 MED ORDER — DEXMEDETOMIDINE HCL IN NACL 80 MCG/20ML IV SOLN
INTRAVENOUS | Status: DC | PRN
  Administered 2023-11-16 (×3): 4 ug via INTRAVENOUS

## 2023-11-16 MED ORDER — ASPIRIN 325 MG PO TBEC
325.0000 mg | DELAYED_RELEASE_TABLET | Freq: Every day | ORAL | 0 refills | Status: AC
Start: 2023-11-16 — End: 2023-11-30

## 2023-11-16 MED ORDER — TRANEXAMIC ACID-NACL 1000-0.7 MG/100ML-% IV SOLN
1000.0000 mg | INTRAVENOUS | Status: AC
  Administered 2023-11-16: 1000 mg via INTRAVENOUS

## 2023-11-16 MED ORDER — CHLORHEXIDINE GLUCONATE 0.12 % MT SOLN
15.0000 mL | Freq: Once | OROMUCOSAL | Status: AC
  Administered 2023-11-16: 15 mL via OROMUCOSAL

## 2023-11-16 MED ORDER — SODIUM CHLORIDE 0.9 % IR SOLN
Status: DC | PRN
  Administered 2023-11-16: 1000 mL

## 2023-11-16 MED ORDER — LIDOCAINE HCL (CARDIAC) PF 100 MG/5ML IV SOSY
PREFILLED_SYRINGE | INTRAVENOUS | Status: DC | PRN
  Administered 2023-11-16: 60 mg via INTRAVENOUS

## 2023-11-16 MED ORDER — TRANEXAMIC ACID-NACL 1000-0.7 MG/100ML-% IV SOLN
1000.0000 mg | INTRAVENOUS | Status: AC
Start: 2023-11-16 — End: 2023-11-16
  Administered 2023-11-16: 1000 mg via INTRAVENOUS

## 2023-11-16 MED ORDER — OXYCODONE HCL 5 MG PO TABS: 5.0000 mg | ORAL_TABLET | ORAL | 0 refills | Status: AC | PRN

## 2023-11-16 MED ORDER — PHENYLEPHRINE HCL-NACL 20-0.9 MG/250ML-% IV SOLN
INTRAVENOUS | Status: DC | PRN
  Administered 2023-11-16: 30 ug/min via INTRAVENOUS
  Administered 2023-11-16: 20 ug/min via INTRAVENOUS

## 2023-11-16 MED ORDER — CEFAZOLIN SODIUM-DEXTROSE 2-4 GM/100ML-% IV SOLN
2.0000 g | Freq: Once | INTRAVENOUS | Status: AC
  Administered 2023-11-16: 2 g via INTRAVENOUS

## 2023-11-16 MED ORDER — EPHEDRINE SULFATE-NACL 50-0.9 MG/10ML-% IV SOSY
PREFILLED_SYRINGE | INTRAVENOUS | Status: DC | PRN
  Administered 2023-11-16 (×2): 5 mg via INTRAVENOUS

## 2023-11-16 MED ORDER — PROPOFOL 10 MG/ML IV BOLUS
INTRAVENOUS | Status: DC | PRN
  Administered 2023-11-16: 150 mg via INTRAVENOUS

## 2023-11-16 MED ORDER — CEFAZOLIN SODIUM-DEXTROSE 2-4 GM/100ML-% IV SOLN
2.0000 g | INTRAVENOUS | Status: AC
  Administered 2023-11-16: 2 g via INTRAVENOUS

## 2023-11-16 MED ORDER — ACETAMINOPHEN 500 MG PO TABS: 1000.0000 mg | ORAL_TABLET | Freq: Three times a day (TID) | ORAL | 2 refills | Status: AC

## 2023-11-16 MED ORDER — FENTANYL CITRATE PF 50 MCG/ML IJ SOSY
50.0000 ug | PREFILLED_SYRINGE | Freq: Once | INTRAMUSCULAR | Status: AC
  Administered 2023-11-16: 50 ug via INTRAVENOUS

## 2023-11-16 SURGICAL SUPPLY — 75 items
BASEPLATE P2 COATD GLND 6.5X30 (Shoulder) IMPLANT
BLADE SAGITTAL WIDE XTHICK NO (BLADE) ×1 IMPLANT
CHLORAPREP W/TINT 26 (MISCELLANEOUS) ×1 IMPLANT
CNTNR URN SCR LID CUP LEK RST (MISCELLANEOUS) IMPLANT
COOLER POLAR GLACIER W/PUMP (MISCELLANEOUS) ×1 IMPLANT
DERMABOND ADVANCED .7 DNX12 (GAUZE/BANDAGES/DRESSINGS) IMPLANT
DRAPE INCISE IOBAN 66X45 STRL (DRAPES) ×1 IMPLANT
DRAPE SHEET LG 3/4 BI-LAMINATE (DRAPES) ×2 IMPLANT
DRAPE TABLE BACK 80X90 (DRAPES) ×1 IMPLANT
DRSG OPSITE POSTOP 3X4 (GAUZE/BANDAGES/DRESSINGS) IMPLANT
DRSG OPSITE POSTOP 4X8 (GAUZE/BANDAGES/DRESSINGS) IMPLANT
DRSG TEGADERM 2-3/8X2-3/4 SM (GAUZE/BANDAGES/DRESSINGS) IMPLANT
ELECT REM PT RETURN 9FT ADLT (ELECTROSURGICAL) ×1 IMPLANT
ELECTRODE REM PT RTRN 9FT ADLT (ELECTROSURGICAL) ×1 IMPLANT
EVACUATOR 1/8 PVC DRAIN (DRAIN) ×1 IMPLANT
GAUZE SPONGE 2X2 STRL 8-PLY (GAUZE/BANDAGES/DRESSINGS) IMPLANT
GAUZE XEROFORM 1X8 LF (GAUZE/BANDAGES/DRESSINGS) IMPLANT
GLOVE BIOGEL PI IND STRL 8 (GLOVE) ×2 IMPLANT
GLOVE PI ULTRA LF STRL 7.5 (GLOVE) ×2 IMPLANT
GLOVE SURG ORTHO 8.0 STRL STRW (GLOVE) ×2 IMPLANT
GLOVE SURG SYN 8.0 (GLOVE) ×1 IMPLANT
GLOVE SURG SYN 8.0 PF PI (GLOVE) ×1 IMPLANT
GOWN STRL REUS W/ TWL LRG LVL3 (GOWN DISPOSABLE) ×2 IMPLANT
GOWN STRL REUS W/ TWL XL LVL3 (GOWN DISPOSABLE) ×1 IMPLANT
GUIDE BONE MODEL RSA DJO (ORTHOPEDIC DISPOSABLE SUPPLIES) IMPLANT
GUIDE WIRE ALTIVATE 2.4X228 SL (WIRE) IMPLANT
HOOD PEEL AWAY T7 (MISCELLANEOUS) ×2 IMPLANT
INSERT HUMERAL SOCKET HXE+ 32 (Insert) IMPLANT
KIT STABILIZATION SHOULDER (MISCELLANEOUS) ×1 IMPLANT
MANIFOLD NEPTUNE II (INSTRUMENTS) ×1 IMPLANT
MASK FACE SPIDER DISP (MASK) ×1 IMPLANT
MAT ABSORB FLUID 56X50 GRAY (MISCELLANEOUS) ×1 IMPLANT
NDL REVERSE CUT 1/2 CRC (NEEDLE) IMPLANT
NDL SPNL 20GX3.5 QUINCKE YW (NEEDLE) IMPLANT
NEEDLE REVERSE CUT 1/2 CRC (NEEDLE) IMPLANT
NEEDLE SPNL 20GX3.5 QUINCKE YW (NEEDLE) IMPLANT
NS IRRIG 500ML POUR BTL (IV SOLUTION) ×1 IMPLANT
P2 COATDE GLNOID BSEPLT 6.5X30 (Shoulder) ×1 IMPLANT
PACK ARTHROSCOPY SHOULDER (MISCELLANEOUS) ×1 IMPLANT
PAD WRAPON POLAR SHDR XLG (MISCELLANEOUS) ×1 IMPLANT
PULSAVAC PLUS IRRIG FAN TIP (DISPOSABLE) ×1 IMPLANT
SCREW BONE LOCKING RSP 5.0X14 (Screw) ×1 IMPLANT
SCREW BONE LOCKING RSP 5.0X30 (Screw) ×1 IMPLANT
SCREW BONE RSP LOCK 5X14 (Screw) IMPLANT
SCREW BONE RSP LOCK 5X22 (Screw) IMPLANT
SCREW BONE RSP LOCK 5X30 (Screw) IMPLANT
SCREW BONE RSP LOCKING 5.0X32 (Screw) ×1 IMPLANT
SCREW RETAIN W/HEAD 4MM OFFSET (Shoulder) IMPLANT
SLING ULTRA II LG (MISCELLANEOUS) IMPLANT
SLING ULTRA II M (MISCELLANEOUS) IMPLANT
SOL .9 NS 3000ML IRR UROMATIC (IV SOLUTION) ×1 IMPLANT
SPONGE T-LAP 18X18 ~~LOC~~+RFID (SPONGE) ×1 IMPLANT
STAPLER SKIN PROX 35W (STAPLE) IMPLANT
STEM HUMERAL SM SHELL 12X48 (Stem) IMPLANT
STEM HUMERAL SMALL SHELL 12X48 (Stem) ×1 IMPLANT
STRAP SAFETY 5IN WIDE (MISCELLANEOUS) ×1 IMPLANT
SUT ETHIBOND 5-0 MS/4 CCS GRN (SUTURE) ×1 IMPLANT
SUT FIBERWIRE #2 38 BLUE 1/2 (SUTURE) ×1 IMPLANT
SUT MNCRL AB 4-0 PS2 18 (SUTURE) IMPLANT
SUT PROLENE 6 0 P 1 18 (SUTURE) IMPLANT
SUT TICRON 2-0 30IN 311381 (SUTURE) ×2 IMPLANT
SUT VIC AB 0 CT1 36 (SUTURE) ×1 IMPLANT
SUT VIC AB 2-0 CT2 27 (SUTURE) ×2 IMPLANT
SUT XBRAID 1.4 BLK/WHT (SUTURE) IMPLANT
SUT XBRAID 1.4 BLUE (SUTURE) IMPLANT
SUT XBRAID 1.4 WHITE/BLUE (SUTURE) IMPLANT
SUT XBRAID 2 BLACK/BLUE (SUTURE) IMPLANT
SUTURE ETHBND 5-0 MS/4 CCS GRN (SUTURE) ×1 IMPLANT
SUTURE FIBERWR #2 38 BLUE 1/2 (SUTURE) ×1 IMPLANT
SYR 30ML LL (SYRINGE) IMPLANT
TAP CANN GLEN 6.5 (TAP) IMPLANT
TIP FAN IRRIG PULSAVAC PLUS (DISPOSABLE) ×1 IMPLANT
TRAP FLUID SMOKE EVACUATOR (MISCELLANEOUS) ×1 IMPLANT
WATER STERILE IRR 1000ML POUR (IV SOLUTION) ×1 IMPLANT
WRAPON POLAR PAD SHDR XLG (MISCELLANEOUS) ×1 IMPLANT

## 2023-11-16 NOTE — Anesthesia Preprocedure Evaluation (Signed)
 Anesthesia Evaluation  Patient identified by MRN, date of birth, ID band Patient awake    Reviewed: Allergy & Precautions, NPO status , Patient's Chart, lab work & pertinent test results  History of Anesthesia Complications Negative for: history of anesthetic complications  Airway Mallampati: II  TM Distance: >3 FB Neck ROM: full    Dental no notable dental hx.    Pulmonary neg pulmonary ROS   Pulmonary exam normal        Cardiovascular negative cardio ROS Normal cardiovascular exam     Neuro/Psych negative neurological ROS  negative psych ROS   GI/Hepatic negative GI ROS, Neg liver ROS,,,  Endo/Other  negative endocrine ROS    Renal/GU negative Renal ROS     Musculoskeletal   Abdominal   Peds  Hematology negative hematology ROS (+)   Anesthesia Other Findings Past Medical History: 2016: Chronic cholecystitis No date: Colon polyps No date: Hearing loss of both ears     Comment:  hearing aids used No date: Mixed hyperlipidemia No date: Osteopenia No date: Vitamin B 12 deficiency No date: Vitamin D deficiency  Past Surgical History: 1956: ANKLE SURGERY; Left     Comment:  tendon repair; cut from glass jar 1980: APPENDECTOMY No date: BREAST EXCISIONAL BIOPSY; Left     Comment:  negative 08/2013: CATARACT EXTRACTION W/ INTRAOCULAR LENS IMPLANT; Right 10/2013: CATARACT EXTRACTION W/ INTRAOCULAR LENS IMPLANT; Left 02/2015: CHOLECYSTECTOMY No date: COLONOSCOPY     Comment:  2020, 2023 2013: SHOULDER ARTHROSCOPY W/ ROTATOR CUFF REPAIR; Left  BMI    Body Mass Index: 26.62 kg/m      Reproductive/Obstetrics negative OB ROS                             Anesthesia Physical Anesthesia Plan  ASA: 2  Anesthesia Plan: General ETT   Post-op Pain Management: Regional block*, Ofirmev IV (intra-op)* and Toradol IV (intra-op)*   Induction: Intravenous  PONV Risk Score and Plan: 3  and Ondansetron, Dexamethasone and Treatment may vary due to age or medical condition  Airway Management Planned: Oral ETT  Additional Equipment:   Intra-op Plan:   Post-operative Plan: Extubation in OR  Informed Consent: I have reviewed the patients History and Physical, chart, labs and discussed the procedure including the risks, benefits and alternatives for the proposed anesthesia with the patient or authorized representative who has indicated his/her understanding and acceptance.     Dental Advisory Given  Plan Discussed with: Anesthesiologist, CRNA and Surgeon  Anesthesia Plan Comments: (Patient consented for risks of anesthesia including but not limited to:  - adverse reactions to medications - damage to eyes, teeth, lips or other oral mucosa - nerve damage due to positioning  - sore throat or hoarseness - Damage to heart, brain, nerves, lungs, other parts of body or loss of life  Patient voiced understanding and assent.)       Anesthesia Quick Evaluation

## 2023-11-16 NOTE — Anesthesia Procedure Notes (Signed)
 Procedure Name: Intubation Date/Time: 11/16/2023 11:37 AM  Performed by: Elisabeth Pigeon, CRNAPre-anesthesia Checklist: Patient identified, Patient being monitored, Timeout performed, Emergency Drugs available and Suction available Patient Re-evaluated:Patient Re-evaluated prior to induction Oxygen Delivery Method: Circle system utilized Preoxygenation: Pre-oxygenation with 100% oxygen Induction Type: IV induction Ventilation: Mask ventilation without difficulty Laryngoscope Size: Mac, 3 and McGrath Grade View: Grade I Tube type: Oral Tube size: 6.5 mm Number of attempts: 1 Airway Equipment and Method: Stylet Placement Confirmation: ETT inserted through vocal cords under direct vision, positive ETCO2 and breath sounds checked- equal and bilateral Secured at: 22 cm Tube secured with: Tape Dental Injury: Teeth and Oropharynx as per pre-operative assessment

## 2023-11-16 NOTE — Anesthesia Procedure Notes (Signed)
 Anesthesia Regional Block: Interscalene brachial plexus block   Pre-Anesthetic Checklist: , timeout performed,  Correct Patient, Correct Site, Correct Laterality,  Correct Procedure, Correct Position, site marked,  Risks and benefits discussed,  Surgical consent,  Pre-op evaluation,  At surgeon's request and post-op pain management  Laterality: Left  Prep: alcohol swabs       Needles:  Injection technique: Single-shot  Needle Type: Echogenic Needle     Needle Length: 4cm  Needle Gauge: 25     Additional Needles:   Procedures:,,,, ultrasound used (permanent image in chart),,    Narrative:  Start time: 11/16/2023 10:52 AM End time: 11/16/2023 10:55 AM Injection made incrementally with aspirations every 5 mL.  Performed by: Personally  Anesthesiologist: Louie Boston, MD  Additional Notes: Patient's chart reviewed and they were deemed appropriate candidate for procedure, at surgeon's request. Patient educated about risks, benefits, and alternatives of the block including but not limited to: temporary or permanent nerve damage, bleeding, infection, damage to surround tissues, pneumothorax, hemidiaphragmatic paralysis, unilateral Horner's syndrome, block failure, local anesthetic toxicity. Patient expressed understanding. A formal time-out was conducted consistent with institution rules.  Monitors were applied, and minimal sedation used (see nursing record). The site was prepped with skin prep and allowed to dry, and sterile gloves were used. A high frequency linear ultrasound probe with probe cover was utilized throughout. C5-7 nerve roots located and appeared anatomically normal, local anesthetic injected around them, and echogenic block needle trajectory was monitored throughout. Aspiration performed every 5ml. Lung and blood vessels were avoided. All injections were performed without resistance and free of blood and paresthesias. The patient tolerated the procedure well.  Injectate:  20ml exparel

## 2023-11-16 NOTE — H&P (Signed)
 Paper H&P to be scanned into permanent record. H&P reviewed. No significant changes noted.

## 2023-11-16 NOTE — Op Note (Addendum)
 SURGERY DATE: 11/16/2023   PRE-OP DIAGNOSIS:  1. Left shoulder glenohumeral arthritis   POST-OP DIAGNOSIS:  1. Left shoulder glenohumeral arthritis   PROCEDURES:  1. Left reverse total shoulder arthroplasty 2. Left biceps tenodesis   SURGEON: Rosealee Albee, MD  ASSISTANTS: OR Staff   ANESTHESIA: Gen + interscalene block   ESTIMATED BLOOD LOSS: 200cc   TOTAL IV FLUIDS: per anesthesia record  IMPLANTS: [employer]: RSP Glenoid Head w/Retaining screw 32-4; Monoblock Reverse Shoulder Baseplate with 6.91mm central screw; 3 locking screws into baseplate (superior, anterior, inferior); Small Shell Short Humeral Stem 12 x 48mm; Neutral Semi-Constrained Small Socket Insert;    INDICATION(S):  Christie Ayers is a 83 y.o. female with chronic shoulder pain with inability to lift arm overhead. Imaging consistent with massive, irreparable rotator cuff tear. Conservative measures including medications and cortisone injections have not provided adequate relief. After discussion of risks, benefits, and alternatives to surgery, the patient elected to proceed with reverse shoulder arthroplasty and biceps tenodesis.   OPERATIVE FINDINGS: glenohumeral arthritis, loose bodies/debris in the glenohumeral joint, biceps tendinopathy   OPERATIVE REPORT:   I identified Christie Ayers in the pre-operative holding area. Informed consent was obtained and the surgical site was marked. I reviewed the risks and benefits of the proposed surgical intervention and the patient wished to proceed. An interscalene block with Exparel was administered by the Anesthesia team. The patient was transferred to the operative suite and general anesthesia was administered. The patient was placed in the beach chair position with the head of the bed elevated approximately 45 degrees. All down side pressure points were appropriately padded. Pre-op exam under anesthesia confirmed some stiffness and crepitus. Appropriate IV antibiotics  were administered. The extremity was then prepped and draped in standard fashion. A time out was performed confirming the correct extremity, correct patient, and correct procedure.   We used the standard deltopectoral incision from the coracoid to ~12cm distal. We found the cephalic vein and took it laterally. We opened the deltopectoral interval widely and placed retractors under the CA ligament in the subacromial space and under the deltoid tendon at its insertion. We then abducted and internally rotated the arm and released the underlying bursa between these retractors, taking care not to damage the circumflex branch of the axillary nerve.   Next, we brought the arm back in adduction at slight forward flexion with external rotation. We opened the clavipectoral fascia lateral to the conjoint tendon. We gently palpated the axillary nerve and verified its position and continuity on both sides of the humerus with a Tug test. This test was repeated multiple times during the procedure for nerve localization and confirmed to be intact at the end of the case. We then cauterized the anterior humeral circumflex ("Three sisters") vessels. The arm was then internally rotated, we cut the falciform ligament at approximately 1 cm of the upper portion of the pectoralis major insertion. Next we unroofed the bicipital groove. We proceeded with a soft tissue biceps tenodesis given the pathology (significant thickening and tendinopathy proximally) of the tendon.  After opening the biceps tendon sheath all the way to the supraglenoid tubercle, we performed a biceps tenodesis with two #2 TiCron sutures to the upper border of the pectoralis major. The proximal portion of the tendon was excised.   The rotator cuff was intact. We performed a subscapularis peel using electrocautery to remove the anterior capsule and subscapularis off of the humeral head. We released the inferior capsule from the humerus  all the way to the posterior  band of the inferior glenohumeral ligament. When this was complete we gently dislocated the shoulder up into the wound. We removed any osteophytes and made our cut with the appropriate inclination in 20 degrees of retroversion  We then turned our attention back to the glenoid. The proximal humerus was retracted posteriorly. The anterior capsule was dissected free from the subscapularis. The anterior capsule was then excised, exposing the anterior glenoid. We then grasped the labrum and removed it circumferentially. During the glenoid exposure, the axillary nerve was protected the entire time.    A patient-specific guide was used to drill the central guidepin. An appropriately sized reamer was used to ream the glenoid. A cannulated tap was placed over the guidepin. The monoblock baseplate was inserted and excellent fixation was achieved such that the entire scapula rotated with further attempted seating of the baseplate. The 3 peripheral screws were drilled, measured, and placed. The wound was thoroughly irrigated. The glenosphere was then placed and tightened.   We then turned our attention back to the humerus. We sized for a small shell prosthesis. We sequentially used larger diameter canal finders until we met appropriate resistance and sequential broaching was performed to this size listed above. Trial poly inserts were placed. The humerus was trialed and noted to have satisfactory stability, motion, and deltoid tension with above listed poly. The trial implants were removed. 3 drill holes were placed about the lesser tuberosity footprint and FiberWire sutures were passed through these holes for subscapularis repair. Next, the implant was placed with the appropriate retroversion. Stability was confirmed. We placed the actual poly insert. The humerus was reduced and motion, tension, and stability were satisfactory. A Hemovac drain was placed. The wound was thoroughly irrigated. Subscapularis was repaired  with the previously passed #2 FiberWire sutures after passing them through the subscapularis.   We again verified the tension on the axillary nerve, appropriate range of motion, stability of the implant, and security of the subscapularis repair. We closed the deltopectoral interval deep to the cephalic vein with a running, 0-Vicryl suture. The skin was closed with 2-0 Vicryl and 4-0 Monocryl. Xeroform and Honeycomb dressing was applied. A PolarCare unit and sling were placed. Patient was extubated, transferred to a stretcher bed and to the post anesthesia care unit in stable condition.    POSTOPERATIVE PLAN: The patient will be discharged home from the PACU. Operative arm to remain in sling at all times except RoM exercises and hygiene. Can perform pendulums, elbow/wrist/hand RoM exercises. Passive RoM allowed to 90 FF and 30 ER. ASA 325mg  x 6 weeks for DVT ppx. Plan for home health PT. Patient to return to clinic in ~2 weeks for post-operative appointment.

## 2023-11-16 NOTE — Discharge Instructions (Addendum)
 Rosealee Albee, MD  [facility]  Phone: [phone]  Fax: [phone]   Discharge Instructions after Reverse Shoulder Replacement    1. Activity/Sling: You are to be non-weight bearing on operative extremity. A sling/shoulder immobilizer has been provided for you. Only remove the sling to perform elbow, wrist, and hand RoM exercises and hygiene/dressing. Active reaching and lifting are not permitted. You will be given further instructions on sling use at your first physical therapy visit and postoperative visit with Dr. Allena Katz.   2. Dressings: Dressing may be removed at 1st physical therapy visit (~3-4 days after surgery). Afterwards, you may either leave open to air (if no drainage) or cover with dry, sterile dressing. If you have steri-strips on your wound, please do not remove them. They will fall off on their own. You may shower 5 days after surgery. Please pat incision dry. Do not rub or place any shear forces across incision. If there is drainage or any opening of incision after 5 days, please notify our offices immediately.    3. Driving:  Plan on not driving for six weeks. Please note that you are advised NOT to drive while taking narcotic pain medications as you may be impaired and unsafe to drive.   4. Medications:  - You have been provided a prescription for narcotic pain medicine (usually oxycodone). After surgery, take 1-2 narcotic tablets every 4 hours if needed for severe pain. Please start this as soon as you begin to start having pain (if you received a nerve block, start taking as soon as this wears off).  - A prescription for anti-nausea medication will be provided in case the narcotic medicine causes nausea - take 1 tablet every 6 hours only if nauseated.  - Take enteric coated aspirin 325 mg once daily for 6 weeks to prevent blood clots. Do not take aspirin if you have an aspirin sensitivity/allergy or asthma or are on an anticoagulant (blood thinner) already. If so, then  your home anticoagulant will be resume and managed - do not take aspirin. -Take tylenol 1000mg  (2 Extra strength or 3 regular strength tablets) every 8 hours for pain. This will reduce the amount of narcotic medication needed. May stop tylenol when you are having minimal pain. - Take a stool softener (Colace, Dulcolax or Senakot) if you are using narcotic pain medications to help with constipation that is associated with narcotic use. - DO NOT take ANY nonsteroidal anti-inflammatory pain medications: Advil, Motrin, Ibuprofen, Aleve, Naproxen, or Naprosyn.   If you are taking prescription medication for anxiety, depression, insomnia, muscle spasm, chronic pain, or for attention deficit disorder you are advised that you are at a higher risk of adverse effects with use of narcotics post-op, including narcotic addiction/dependence, depressed breathing, death. If you use non-prescribed substances: alcohol, marijuana, cocaine, heroin, methamphetamines, etc., you are at a higher risk of adverse effects with use of narcotics post-op, including narcotic addiction/dependence, depressed breathing, death. You are advised that taking > 50 morphine milligram equivalents (MME) of narcotic pain medication per day results in twice the risk of overdose or death. For your prescription provided: oxycodone 5 mg - taking more than 6 tablets per day after the first few days of surgery.   5. Physical Therapy: Home health PT will be set up for you for the first 6 weeks, then transition to outpatient physical therapy.    6. Work: May do light duty/desk job in approximately 2 weeks when off of narcotics, pain is well-controlled, and swelling has  decreased if able to function with one arm in sling. Full work may take 6 weeks if light motions and function of both arms is required. Lifting jobs may require 12 weeks.   7. Post-Op Appointments: Your first post-op appointment will be with Dr. Allena Katz in approximately 2 weeks time.     If you find that they have not been scheduled please call the Orthopaedic Appointment front desk at [phone].                               Rosealee Albee, MD [facility] Phone: [phone] Fax: [phone]   REVERSE SHOULDER ARTHROPLASTY REHAB GUIDELINES   These guidelines should be tailored to individual patients based on their rehab goals, age, precautions, quality of repair, etc.  Progression should be based on patient progress and approval by the referring physician.  PHASE 1 - Day 1 through Week 2  GENERAL GUIDELINES AND PRECAUTIONS Sling wear 24/7 except during grooming and home exercises (3 to 5 times daily) Avoid shoulder extension such that the arm is posterior the frontal plane.  When patients recline, a pillow should be placed behind the upper arm and sling should be on.  They should be advised to always be able to see the elbow Avoid combined IR/ADD/EXT, such as hand behind back to prevent dislocation Avoid combined IR and ADD such as reaching across the chest to prevent dislocation No AROM No submersion in pool/water for 4 weeks No weight bearing through operative arm (as in transfers, walker use, etc.)  GOALS Maintain integrity of joint replacement; protect soft tissue healing Increase PROM for elevation to 120 and ER to 30 (will remain the goal for first 6 weeks) Optimize distal UE circulation and muscle activity (elbow, wrist and hand) Instruct in use of sling for proper fit, polar care device for ice application after HEP, signs/symptoms of infection  EXERCISES Active elbow, wrist and hand Passive forward elevation in scapular plane to 90-120 max motion; ER in scapular plane to 30 Active scapular retraction with arms resting in neutral position  CRITERIA TO PROGRESS TO PHASE 2 Low pain (less than 3/10) with shoulder PROM Healing of incision without signs of infection Clearance by MD to advance after 2 week MD check  up  PHASE 2 - 2 weeks - 6 weeks  GENERAL GUIDELINES AND PRECAUTIONS Sling may be removed while at home; worn in community without abduction pillow May use arm for light activities of daily living (such as feeding, brushing teeth, dressing.) with elbow near  the side of the body  and arm in front of the body- no active lifting of the arm May submerge in water (tub, pool, Britt, etc.) after 4 weeks Continue to avoid WBing through the operative arm Continue to avoid combined IR/EXT/ADD (hand behind the back) and IR/ADD  (reaching across chest) for dislocation precautions  GOALS  Achieve passive elevation to 120 and ER to 30  Low (less than 3/10) to no pain  Ability to fire all heads of the deltoid  EXERCISES May discontinue grip, and active elbow and wrist exercises since using the arm in ADL's  with sling removed around the home Continue passive elevation to 120 and ER to 30, both in scapular plane with arm supported on table top Add submaximal isometrics, pain free effort, for all functional heads of deltoid (anterior, posterior, middle)  Ensure that with posterior deltoid isometric the shoulder does not move into extension  and the arm remains anterior the frontal plane At 4 weeks:  begin to place arm in balanced position of 90 deg elevation in supine; when patient able to hold this position with ease, may begin reverse pendulums clockwise and counterclockwise  CRITERIA TO PROGRESS TO PHASE 3 Passive forward elevation in scapular plane to 120; passive ER in scapular plane to 30 Ability to fire isometrically all heads of the deltoid muscle without pain Ability to place and hold the arm in balanced position (90 deg elevation in supine)  PHASE 3 - 6 weeks to 3 months  GENERAL GUIDELINES AND PRECAUTIONS Discontinue use of sling Avoid forcing end range motion in any direction to prevent dislocation  May advance use of the arm actively in ADL's without being restricted to arm by the side  of the body, however, avoid heavy lifting and sports (forever!) May initiate functional IR behind the back gently NO UPPER BODY ERGOMETER   GOALS Optimize PROM for elevation and ER in scapular plane with realistic expectation that max  mobility for elevation is usually around 145-160 passively; ER 40 to 50 passively; functional IR to L1 Recover AROM to approach as close to PROM available as possible; may expect 135-150 deg active elevation; 30 deg active ER; active functional IR to L1 Establish dynamic stability of the shoulder with deltoid and periscapular muscle gradual strengthening  EXERCISES Forward elevation in scapular plane active progression: supine to incline, to vertical; short to long lever arm Balanced position long lever arm AROM Active ER/IR with arm at side Scapular retraction with light band resistance Functional IR with hand slide up back - very gentle and gradual NO UPPER BODY ERGOMETER     CRITERIA TO PROGRESS TO PHASE 4  AROM equals/approaches PROM with good mechanics for elevation   No pain  Higher level demand on shoulder than ADL functions   PHASE 4 12 months and beyond  GENERAL GUIDELINES AND PRECAUTIONS No heavy lifting and no overhead sports No heavy pushing activity Gradually increase strength of deltoid and scapular stabilizers; also the rotator cuff if present with weights not to exceed 5 lbs NO UPPER BODY ERGOMETER   GOALS  Optimize functional use of the operative UE to meet the desired demands  Gradual increase in deltoid, scapular muscle, and rotator cuff strength  Pain free functional activities   EXERCISES Add light hand weights for deltoid up to and not to exceed 3 lbs for anterior and posterior with long arm lift against gravity; elbow bent to 90 deg for abduction in scapular plane Theraband progression for extension to hip with scapular depression/retraction Theraband progression for serratus anterior punches in supine; avoid wall,  incline or prone pressups for serratus anterior End range stretching gently without forceful overpressure in all planes (elevation in scapular plane, ER in scapular plane, functional IR) with stretching done for life as part of a daily routine NO UPPER BODY ERGOMETER     CRITERIA FOR DISCHARGE FROM SKILLED PHYSICAL THERAPY  Pain free AROM for shoulder elevation (expect around 135-150)  Functional strength for all ADL's, work tasks, and hobbies approved by Careers adviser  Independence with home maintenance program   NOTES: 1. With proper exercise, motion, strength, and function continue to improve even after one year. 2. The complication rate after surgery is 5 - 8%. Complications include infection, fracture, heterotopic bone formation, nerve injury, instability, rotator cuff tear, and tuberosity nonunion. Please look for clinical signs, unusual symptoms, or lack of progress with therapy and report those to  Dr. Allena Katz. Prefer more communication than less.  3. The therapy plan above only serves as a guide. Please be aware of specific individualized patient instructions as written on the prescription or through discussions with the surgeon. 4. Please call Dr. Allena Katz if you have any specific questions or concerns [phone]   Information for Discharge Teaching: EXPAREL (bupivacaine liposome injectable suspension)   Pain relief is important to your recovery. The goal is to control your pain so you can move easier and return to your normal activities as soon as possible after your procedure. Your physician may use several types of medicines to manage pain, swelling, and more.  Your surgeon or anesthesiologist gave you EXPAREL(bupivacaine) to help control your pain after surgery.  EXPAREL is a local anesthetic designed to release slowly over an extended period of time to provide pain relief by numbing the tissue around the surgical site. EXPAREL is designed to release pain medication over time and can  control pain for up to 72 hours. Depending on how you respond to EXPAREL, you may require less pain medication during your recovery. EXPAREL can help reduce or eliminate the need for opioids during the first few days after surgery when pain relief is needed the most. EXPAREL is not an opioid and is not addictive. It does not cause sleepiness or sedation.   Important! A teal colored band has been placed on your arm with the date, time and amount of EXPAREL you have received. Please leave this armband in place for the full 96 hours following administration, and then you may remove the band. If you return to the hospital for any reason within 96 hours following the administration of EXPAREL, the armband provides important information that your health care providers to know, and alerts them that you have received this anesthetic.    Possible side effects of EXPAREL: Temporary loss of sensation or ability to move in the area where medication was injected. Nausea, vomiting, constipation Rarely, numbness and tingling in your mouth or lips, lightheadedness, or anxiety may occur. Call your doctor right away if you think you may be experiencing any of these sensations, or if you have other questions regarding possible side effects.  Follow all other discharge instructions given to you by your surgeon or nurse. Eat a healthy diet and drink plenty of water or other fluids.    POLAR CARE INFORMATION  MassAdvertisement.it  How to use [facility] Polar Care [facility] Therapy System?  YouTube   ShippingScam.co.uk  OPERATING INSTRUCTIONS  Start the product With dry hands, connect the transformer to the electrical connection located on the top of the cooler. Next, plug the transformer into an appropriate electrical outlet. The unit will automatically start running at this point.  To stop the pump, disconnect electrical power.  Unplug to stop the product when not in use. Unplugging the Polar  Care unit turns it off. Always unplug immediately after use. Never leave it plugged in while unattended. Remove pad.    FIRST ADD WATER TO FILL LINE, THEN ICE---Replace ice when existing ice is almost melted  1 Discuss Treatment with your Licensed Health Care Practitioner and Use Only as Prescribed 2 Apply Insulation Barrier & Cold Therapy Pad 3 Check for Moisture 4 Inspect Skin Regularly  Tips and Trouble Shooting Usage Tips 1. Use cubed or chunked ice for optimal performance. 2. It is recommended to drain the Pad between uses. To drain the pad, hold the Pad upright with the hose pointed toward the ground. Depress  the black plunger and allow water to drain out. 3. You may disconnect the Pad from the unit without removing the pad from the affected area by depressing the silver tabs on the hose coupling and gently pulling the hoses apart. The Pad and unit will seal itself and will not leak. Note: Some dripping during release is normal. 4. DO NOT RUN PUMP WITHOUT WATER! The pump in this unit is designed to run with water. Running the unit without water will cause permanent damage to the pump. 5. Unplug unit before removing lid.  TROUBLESHOOTING GUIDE Pump not running, Water not flowing to the pad, Pad is not getting cold 1. Make sure the transformer is plugged into the wall outlet. 2. Confirm that the ice and water are filled to the indicated levels. 3. Make sure there are no kinks in the pad. 4. Gently pull on the blue tube to make sure the tube/pad junction is straight. 5. Remove the pad from the treatment site and ll it while the pad is lying at; then reapply. 6. Confirm that the pad couplings are securely attached to the unit. Listen for the double clicks (Figure 1) to confirm the pad couplings are securely attached.  Leaks    Note: Some condensation on the lines, controller, and pads is unavoidable, especially in warmer climates. 1. If using a [employer] Polar Care Cold Therapy unit with a  detachable Cold Therapy Pad, and a leak exists (other than condensation on the lines) disconnect the pad couplings. Make sure the silver tabs on the couplings are depressed before reconnecting the pad to the pump hose; then confirm both sides of the coupling are properly clicked in. 2. If the coupling continues to leak or a leak is detected in the pad itself, stop using it and call [employer] Customer Care at [phone].  Cleaning After use, empty and dry the unit with a soft cloth. Warm water and mild detergent may be used occasionally to clean the pump and tubes.  WARNING: The Polar Care Cube can be cold enough to cause serious injury, including full skin necrosis. Follow these Operating Instructions, and carefully read the Product Insert (see pouch on side of unit) and the Cold Therapy Pad Fitting Instructions (provided with each Cold Therapy Pad) prior to use.   SHOULDER SLING IMMOBILIZER  VIDEO Slingshot 2 Shoulder Brace Application - YouTube ---https://www.porter.info/  INSTRUCTIONS While supporting the injured arm, slide the forearm into the sling. Wrap the adjustable shoulder strap around the neck and shoulders and attach the strap end to the sling using  the "alligator strap tab."  Adjust the shoulder strap to the required length. Position the shoulder pad behind the neck. To secure the shoulder pad location (optional), pull the shoulder strap away from the shoulder pad, unfold the hook material on the top of the pad, then press the shoulder strap back onto the hook material to secure the pad in place. Attach the closure strap across the open top of the sling. Position the strap so that it holds the arm securely in the sling. Next, attach the thumb strap to the open end of the sling between the thumb and fingers. After sling has been fit, it may be easily removed and reapplied using the quick release buckle on shoulder strap. If a neutral pillow or 15 abduction pillow  is included, place the pillow at the waistline. Attach the sling to the pillow, lining up hook material on the pillow with the loop on sling. Adjust the  waist strap to fit.  If waist strap is too long, cut it to fit. Use the small piece of double sided hook material (located on top of the pillow) to secure the strap end. Place the double sided hook material on the inside of the cut strap end and secure it to the waist strap.     If no pillow is included, attach the waist strap to the sling and adjust to fit.    Washing Instructions: Straps and sling must be removed and cleaned regularly depending on your activity level and perspiration. Hand wash straps and sling in cold water with mild detergent, rinse, air dry

## 2023-11-16 NOTE — Anesthesia Postprocedure Evaluation (Signed)
 Anesthesia Post Note  Patient: Christie Ayers  Procedure(s) Performed: ARTHROPLASTY, SHOULDER, TOTAL, REVERSE (Left: Shoulder)  Patient location during evaluation: PACU Anesthesia Type: General Level of consciousness: awake and alert Pain management: pain level controlled Vital Signs Assessment: post-procedure vital signs reviewed and stable Respiratory status: spontaneous breathing, nonlabored ventilation, respiratory function stable and patient connected to nasal cannula oxygen Cardiovascular status: blood pressure returned to baseline and stable Postop Assessment: no apparent nausea or vomiting Anesthetic complications: no   No notable events documented.   Last Vitals:  Vitals:   11/16/23 1430 11/16/23 1451  BP: (!) 126/58 136/65  Pulse: 67 62  Resp: 16 12  Temp:    SpO2: 97% 100%    Last Pain:  Vitals:   11/16/23 1411  TempSrc:   PainSc: 0-No pain                 Louie Boston

## 2023-11-16 NOTE — Evaluation (Signed)
 Physical Therapy Evaluation Patient Details Name: Christie Ayers MRN: [id number] DOB: April 30, 1941 Today's Date: 11/16/2023  History of Present Illness  Pt admitted for L TSA with bicep tenodesis and is POD 0 at time of evaluation.  Clinical Impression  Pt is a pleasant 83 year old female who was admitted for L TSA with bicep tenodesis with plan to dc this date. Pt performs transfers and ambulation with cga and 1 hand assist. Also performed stair training for safe entry to home. Pt demonstrates deficits with strength/mobility. Would benefit from skilled PT to address above deficits and promote optimal return to PLOF. Per orders, pt will have HHPT at discharge. Left with OT at bedside. Will dc current orders.         If plan is discharge home, recommend the following: A little help with walking and/or transfers   Can travel by private vehicle        Equipment Recommendations None recommended by PT  Recommendations for Other Services       Functional Status Assessment Patient has had a recent decline in their functional status and demonstrates the ability to make significant improvements in function in a reasonable and predictable amount of time.     Precautions / Restrictions Precautions Precautions: Fall Recall of Precautions/Restrictions: Intact Restrictions Weight Bearing Restrictions Per Provider Order: No      Mobility  Bed Mobility               General bed mobility comments: not performed, received seated in recliner    Transfers Overall transfer level: Needs assistance Equipment used: 1 person hand held assist Transfers: Sit to/from Stand Sit to Stand: Contact guard assist           General transfer comment: cues for sequencing. Once standing, able to progress to supervision. Slight unsteadiness    Ambulation/Gait Ambulation/Gait assistance: Contact guard assist Gait Distance (Feet): 80 Feet Assistive device: 1 person hand held assist Gait  Pattern/deviations: Step-through pattern       General Gait Details: ambulated around hallway with slight unsteadiness. No formal LOB.  Stairs Stairs: Yes Stairs assistance: Min assist Stair Management: No rails Number of Stairs: 2 General stair comments: up/down without railing with min assist. Educated on having additional person at home to assist with needs  Wheelchair Mobility     Tilt Bed    Modified Rankin (Stroke Patients Only)       Balance Overall balance assessment: Mild deficits observed, not formally tested                                           Pertinent Vitals/Pain Pain Assessment Pain Assessment: No/denies pain    Home Living Family/patient expects to be discharged to:: Private residence Living Arrangements: Alone Available Help at Discharge: Family;Friend(s) (24/7 for 2 days, then PRN from friends) Type of Home: House Home Access: Stairs to enter Entrance Stairs-Rails: None Entrance Stairs-Number of Steps: 2-3   Home Layout: One level Home Equipment: None      Prior Function Prior Level of Function : Independent/Modified Independent;Driving             Mobility Comments: reports no falls, indep ADLs Comments: indep     Extremity/Trunk Assessment   Upper Extremity Assessment Upper Extremity Assessment:  (L UE testing not performed due to recent SX)    Lower Extremity Assessment Lower Extremity Assessment: Overall  WFL for tasks assessed       Communication   Communication Communication: No apparent difficulties    Cognition Arousal: Alert Behavior During Therapy: WFL for tasks assessed/performed   PT - Cognitive impairments: No apparent impairments                       PT - Cognition Comments: agreeable to session Following commands: Intact       Cueing Cueing Techniques: Verbal cues     General Comments      Exercises Other Exercises Other Exercises: ambulated to bathroom, needed  assist for hygiene, as well as dressing   Assessment/Plan    PT Assessment All further PT needs can be met in the next venue of care  PT Problem List Decreased strength;Decreased balance;Decreased mobility;Pain       PT Treatment Interventions      PT Goals (Current goals can be found in the Care Plan section)  Acute Rehab PT Goals Patient Stated Goal: to go home today PT Goal Formulation: All assessment and education complete, DC therapy Time For Goal Achievement: 11/16/23 Potential to Achieve Goals: Good    Frequency       Co-evaluation PT/OT/SLP Co-Evaluation/Treatment: Yes Reason for Co-Treatment: For patient/therapist safety;To address functional/ADL transfers PT goals addressed during session: Mobility/safety with mobility OT goals addressed during session: ADL's and self-care       AM-PAC PT "6 Clicks" Mobility  Outcome Measure Help needed turning from your back to your side while in a flat bed without using bedrails?: None Help needed moving from lying on your back to sitting on the side of a flat bed without using bedrails?: None Help needed moving to and from a bed to a chair (including a wheelchair)?: A Little Help needed standing up from a chair using your arms (e.g., wheelchair or bedside chair)?: A Little Help needed to walk in hospital room?: A Little Help needed climbing 3-5 steps with a railing? : A Little 6 Click Score: 20    End of Session Equipment Utilized During Treatment: Gait belt Activity Tolerance: Patient tolerated treatment well Patient left: in chair Nurse Communication: Mobility status PT Visit Diagnosis: Muscle weakness (generalized) (M62.81);Difficulty in walking, not elsewhere classified (R26.2)    Time: 1610-9604 PT Time Calculation (min) (ACUTE ONLY): 18 min   Charges:   PT Evaluation $PT Eval Low Complexity: 1 Low PT Treatments $Gait Training: 8-22 mins PT General Charges $$ ACUTE PT VISIT: 1 Visit         Christie Ayers, PT, DPT, GCS [phone]   Christie Ayers 11/16/2023, 5:23 PM

## 2023-11-16 NOTE — Transfer of Care (Signed)
 Immediate Anesthesia Transfer of Care Note  Patient: Christie Ayers  Procedure(s) Performed: ARTHROPLASTY, SHOULDER, TOTAL, REVERSE (Left: Shoulder)  Patient Location: PACU  Anesthesia Type:General and Regional  Level of Consciousness: awake  Airway & Oxygen Therapy: Patient Spontanous Breathing and Patient connected to face mask oxygen  Post-op Assessment: Report given to RN and Post -op Vital signs reviewed and unstable, Anesthesiologist notified  Post vital signs: Reviewed and stable  Last Vitals:  Vitals Value Taken Time  BP 111/55 11/16/23 1415  Temp 36.1 C 11/16/23 1411  Pulse 63 11/16/23 1418  Resp 14 11/16/23 1418  SpO2 96 % 11/16/23 1418  Vitals shown include unfiled device data.  Last Pain:  Vitals:   11/16/23 1411  TempSrc:   PainSc: 0-No pain         Complications: No notable events documented.

## 2023-11-16 NOTE — Evaluation (Signed)
 Occupational Therapy Evaluation Patient Details Name: Christie Ayers MRN: [id number] DOB: 07/07/1941 Today's Date: 11/16/2023   History of Present Illness   Pt admitted for L TSA with bicep tenodesis and is POD 0 at time of evaluation.     Clinical Impressions Patient was seen for an OT evaluation this date. Pt lives alone and reports her brother and sister in law will be able to stay with her for 2 days then will have friends assist PRN after that. Prior to surgery, pt was independent. Pt has orders for LUE to be immobilized and will be NWBing per MD. Patient presents with impaired strength, ROM, and sensation to LUE with block in place. These impairments result in a decreased ability to perform self care tasks requiring MOD-MAX A for UB bathing and dressing, MAX A for polar care, sling, and compression stocking mgt. Pt demonstrated ability to complete all aspects of toileting without direct assist. Pt/family instructed in polar care mgt, compression stockings mgt, sling/immobilizer mgt, LUE precautions, adaptive strategies for bathing/dressing/toileting/grooming, positioning and considerations for sleep, and home/routines modifications to maximize falls prevention, safety, and independence. Handout provided. OT adjusted sling/immobilizer and polar care to improve comfort, optimize positioning, and to maximize skin integrity/safety. Pt endorsed feeling overwhelmed but verbalized understanding of all education/training provided. Pt will benefit from ongoing skilled therapy for rehab of shoulder.       If plan is discharge home, recommend the following:   A little help with walking and/or transfers;A lot of help with bathing/dressing/bathroom;Assist for transportation;Help with stairs or ramp for entrance;Assistance with cooking/housework     Functional Status Assessment   Patient has had a recent decline in their functional status and demonstrates the ability to make significant improvements  in function in a reasonable and predictable amount of time.     Equipment Recommendations   None recommended by OT     Recommendations for Other Services         Precautions/Restrictions   Precautions Precautions: Fall Recall of Precautions/Restrictions: Intact Precaution/Restrictions Comments: Operative arm to remain in sling at all times except RoM exercises and hygiene. Can perform pendulums, elbow/wrist/hand RoM exercises. Passive RoM allowed to 90 FF and 30 ER. Restrictions Weight Bearing Restrictions Per Provider Order: Yes LUE Weight Bearing Per Provider Order: Non weight bearing     Mobility Bed Mobility               General bed mobility comments: not performed, received seated in recliner    Transfers Overall transfer level: Needs assistance Equipment used: 1 person hand held assist Transfers: Sit to/from Stand Sit to Stand: Contact guard assist           General transfer comment: cues for sequencing. Once standing, able to progress to supervision. Slight unsteadiness      Balance Overall balance assessment: Mild deficits observed, not formally tested                                         ADL either performed or assessed with clinical judgement   ADL Overall ADL's : Needs assistance/impaired       Grooming Details (indicate cue type and reason): PRN assist needed for L underarm grooming. Able to stand at sink and wash hand with supv         Upper Body Dressing : Sitting;Maximal assistance;Cueing for compensatory techniques;Cueing for UE precautions;Adhering to UE precautions;Cueing  for sequencing Upper Body Dressing Details (indicate cue type and reason): cues for sequencing to support SIL in assisting donning shirt Lower Body Dressing: Sit to/from stand;Set up;Contact guard assist   Toilet Transfer: Regular Toilet;Ambulation;Grab bars;Contact guard assist   Toileting- Clothing Manipulation and Hygiene:  Sitting/lateral lean;Modified independent       Functional mobility during ADLs: Supervision/safety;Contact guard assist       Vision         Perception         Praxis         Pertinent Vitals/Pain Pain Assessment Pain Assessment: No/denies pain     Extremity/Trunk Assessment Upper Extremity Assessment Upper Extremity Assessment: Right hand dominant;LUE deficits/detail LUE Deficits / Details: in sling, impaired sensation 2/2 block LUE Sensation: decreased light touch;decreased proprioception LUE Coordination: decreased fine motor;decreased gross motor   Lower Extremity Assessment Lower Extremity Assessment: Overall WFL for tasks assessed       Communication Communication Communication: No apparent difficulties   Cognition Arousal: Alert Behavior During Therapy: WFL for tasks assessed/performed Cognition: No apparent impairments             OT - Cognition Comments: Pt endorsed feeling overwhelmed                 Following commands: Intact       Cueing  General Comments   Cueing Techniques: Verbal cues      Exercises Other Exercises Other Exercises: Pt/family instructed in post-shoulder surgery recovery including polar care, sling mgt, falls prevention, compression stocking mgt, AE/DME, precautions, ROM, positioning for sleep, hemi techniques for ADL. Handout provided.   Shoulder Instructions      Home Living Family/patient expects to be discharged to:: Private residence Living Arrangements: Alone Available Help at Discharge: Family;Friend(s) (24/7 for 2 days, then PRN from friends) Type of Home: House Home Access: Stairs to enter [employer] of Steps: 2-3 Entrance Stairs-Rails: None Home Layout: One level     Bathroom Shower/Tub: Producer, television/film/video: Standard     Home Equipment: None          Prior Functioning/Environment Prior Level of Function : Independent/Modified Independent;Driving              Mobility Comments: reports no falls, indep ADLs Comments: indep    OT Problem List: Impaired UE functional use;Decreased knowledge of precautions;Impaired balance (sitting and/or standing);Decreased range of motion;Impaired sensation;Decreased coordination   OT Treatment/Interventions:        OT Goals(Current goals can be found in the care plan section)   Acute Rehab OT Goals Patient Stated Goal: be independent OT Goal Formulation: All assessment and education complete, DC therapy   OT Frequency:       Co-evaluation PT/OT/SLP Co-Evaluation/Treatment: Yes Reason for Co-Treatment: For patient/therapist safety;To address functional/ADL transfers PT goals addressed during session: Mobility/safety with mobility OT goals addressed during session: ADL's and self-care      AM-PAC OT "6 Clicks" Daily Activity     Outcome Measure Help from another person eating meals?: None Help from another person taking care of personal grooming?: A Little Help from another person toileting, which includes using toliet, bedpan, or urinal?: A Little Help from another person bathing (including washing, rinsing, drying)?: A Lot Help from another person to put on and taking off regular upper body clothing?: A Lot Help from another person to put on and taking off regular lower body clothing?: A Little 6 Click Score: 17   End of Session Nurse Communication:  Mobility status  Activity Tolerance: Patient tolerated treatment well Patient left: in chair;with call bell/phone within reach;with family/visitor present;with nursing/sitter in room  OT Visit Diagnosis: Other abnormalities of gait and mobility (R26.89)                Time: 3016-0109 OT Time Calculation (min): 57 min Charges:  OT General Charges $OT Visit: 1 Visit OT Evaluation $OT Eval Low Complexity: 1 Low OT Treatments $Self Care/Home Management : 23-37 mins  Arman Filter., MPH, MS, OTR/L ascom [phone] 11/16/23, 5:39 PM

## 2023-11-17 ENCOUNTER — Encounter: Payer: Self-pay | Admitting: Orthopedic Surgery

## 2023-12-03 ENCOUNTER — Encounter: Payer: Self-pay | Admitting: Orthopedic Surgery
